# Patient Record
Sex: Female | Born: 1945 | Race: Black or African American | Hispanic: No | Marital: Married | Smoking: Never smoker
Health system: Southern US, Community
[De-identification: ages and names within clinical notes are randomized; demographics above are authoritative.]

## PROBLEM LIST (undated history)

## (undated) DIAGNOSIS — I10 Essential (primary) hypertension: Secondary | ICD-10-CM

## (undated) DIAGNOSIS — K219 Gastro-esophageal reflux disease without esophagitis: Secondary | ICD-10-CM

## (undated) DIAGNOSIS — Z87410 Personal history of cervical dysplasia: Secondary | ICD-10-CM

## (undated) DIAGNOSIS — M1990 Unspecified osteoarthritis, unspecified site: Secondary | ICD-10-CM

## (undated) DIAGNOSIS — H269 Unspecified cataract: Secondary | ICD-10-CM

## (undated) DIAGNOSIS — S83209A Unspecified tear of unspecified meniscus, current injury, unspecified knee, initial encounter: Secondary | ICD-10-CM

## (undated) DIAGNOSIS — E039 Hypothyroidism, unspecified: Secondary | ICD-10-CM

## (undated) HISTORY — PX: EYE SURGERY: SHX253

## (undated) HISTORY — DX: Essential (primary) hypertension: I10

## (undated) HISTORY — DX: Unspecified cataract: H26.9

## (undated) HISTORY — PX: CHOLECYSTECTOMY: SHX55

## (undated) HISTORY — PX: WISDOM TOOTH EXTRACTION: SHX21

---

## 1978-08-10 HISTORY — PX: TUBAL LIGATION: SHX77

## 1986-08-10 HISTORY — PX: GYNECOLOGIC CRYOSURGERY: SHX857

## 1998-02-06 ENCOUNTER — Other Ambulatory Visit: Admission: RE | Admit: 1998-02-06 | Discharge: 1998-02-06 | Payer: Self-pay | Admitting: Obstetrics and Gynecology

## 1999-03-12 ENCOUNTER — Other Ambulatory Visit: Admission: RE | Admit: 1999-03-12 | Discharge: 1999-03-12 | Payer: Self-pay | Admitting: Obstetrics and Gynecology

## 2000-02-23 ENCOUNTER — Other Ambulatory Visit: Admission: RE | Admit: 2000-02-23 | Discharge: 2000-02-23 | Payer: Self-pay | Admitting: Obstetrics and Gynecology

## 2001-02-23 ENCOUNTER — Other Ambulatory Visit: Admission: RE | Admit: 2001-02-23 | Discharge: 2001-02-23 | Payer: Self-pay | Admitting: Obstetrics and Gynecology

## 2002-03-31 ENCOUNTER — Other Ambulatory Visit: Admission: RE | Admit: 2002-03-31 | Discharge: 2002-03-31 | Payer: Self-pay | Admitting: Obstetrics and Gynecology

## 2003-04-02 ENCOUNTER — Other Ambulatory Visit: Admission: RE | Admit: 2003-04-02 | Discharge: 2003-04-02 | Payer: Self-pay | Admitting: Obstetrics and Gynecology

## 2004-04-02 ENCOUNTER — Other Ambulatory Visit: Admission: RE | Admit: 2004-04-02 | Discharge: 2004-04-02 | Payer: Self-pay | Admitting: Obstetrics and Gynecology

## 2004-10-30 ENCOUNTER — Ambulatory Visit (HOSPITAL_COMMUNITY): Admission: RE | Admit: 2004-10-30 | Discharge: 2004-10-30 | Payer: Self-pay | Admitting: Internal Medicine

## 2005-04-03 ENCOUNTER — Other Ambulatory Visit: Admission: RE | Admit: 2005-04-03 | Discharge: 2005-04-03 | Payer: Self-pay | Admitting: Obstetrics and Gynecology

## 2006-04-07 ENCOUNTER — Other Ambulatory Visit: Admission: RE | Admit: 2006-04-07 | Discharge: 2006-04-07 | Payer: Self-pay | Admitting: Obstetrics and Gynecology

## 2006-04-30 ENCOUNTER — Ambulatory Visit: Payer: Self-pay | Admitting: Gastroenterology

## 2006-05-13 ENCOUNTER — Ambulatory Visit: Payer: Self-pay | Admitting: Gastroenterology

## 2006-05-13 ENCOUNTER — Encounter (INDEPENDENT_AMBULATORY_CARE_PROVIDER_SITE_OTHER): Payer: Self-pay | Admitting: Specialist

## 2007-04-12 ENCOUNTER — Other Ambulatory Visit: Admission: RE | Admit: 2007-04-12 | Discharge: 2007-04-12 | Payer: Self-pay | Admitting: Obstetrics and Gynecology

## 2008-04-18 ENCOUNTER — Ambulatory Visit: Payer: Self-pay | Admitting: Obstetrics and Gynecology

## 2008-04-18 ENCOUNTER — Encounter: Payer: Self-pay | Admitting: Obstetrics and Gynecology

## 2008-04-18 ENCOUNTER — Other Ambulatory Visit: Admission: RE | Admit: 2008-04-18 | Discharge: 2008-04-18 | Payer: Self-pay | Admitting: Obstetrics and Gynecology

## 2009-04-19 ENCOUNTER — Encounter: Payer: Self-pay | Admitting: Obstetrics and Gynecology

## 2009-04-19 ENCOUNTER — Other Ambulatory Visit: Admission: RE | Admit: 2009-04-19 | Discharge: 2009-04-19 | Payer: Self-pay | Admitting: Obstetrics and Gynecology

## 2009-04-19 ENCOUNTER — Ambulatory Visit: Payer: Self-pay | Admitting: Obstetrics and Gynecology

## 2009-08-16 ENCOUNTER — Ambulatory Visit: Payer: Self-pay | Admitting: Obstetrics and Gynecology

## 2009-09-16 DIAGNOSIS — E785 Hyperlipidemia, unspecified: Secondary | ICD-10-CM

## 2009-09-19 ENCOUNTER — Ambulatory Visit: Payer: Self-pay | Admitting: Cardiology

## 2010-04-25 ENCOUNTER — Other Ambulatory Visit: Admission: RE | Admit: 2010-04-25 | Discharge: 2010-04-25 | Payer: Self-pay | Admitting: Obstetrics and Gynecology

## 2010-04-25 ENCOUNTER — Ambulatory Visit: Payer: Self-pay | Admitting: Obstetrics and Gynecology

## 2010-09-11 NOTE — Assessment & Plan Note (Signed)
Summary: np6/ lipid management/ pt ahs state bcbs/ gd   Visit Type:  new pt visit Referring Provider:  Edyth Gunnels Primary Provider:  Edyth Gunnels  CC:  pt her for hyperlipidemia.Marland Kitchenotherwise no other complaints today.  History of Present Illness: Cynthia Bowers is referred today by Dr.Gottsegen for evaluation of her hyperlipidemia.  She is a delightful 65 year old [place] American female who has had a history of mixed hyperlipidemia. Her total cholesterol average around 200 up until recently when it was 253. Her HDL is usually below 35. Her last was 38. Triglycerides were normal the past but now are starting to creep up to 153. Her LDL use runs around 140-150 the last check was 185. Her total cholesterol to HDL ratio was 6.7.  She has gained weight and is fairly liberal with her diet.  She does not smoke, does not have a history of hypertension, and is not diabetic. There is a history of hypertension and diabetes in her family that no premature coronary disease. Her age is now risk factor.  She denies any symptoms of angina or ischemia. She denies any dyspnea on exertion, orthopnea, PND. She's had no peripheral edema.  Current Medications (verified): 1)  Levothyroxine Sodium 50 Mcg Tabs (Levothyroxine Sodium) .Marland Kitchen.. 1 Tab Once Daily 2)  Calcium Carbonate-Vitamin D 600-400 Mg-Unit Tabs (Calcium Carbonate-Vitamin D) .Marland Kitchen.. 1 Tab Once Daily 3)  Afrin Nasal Spray 0.05 % Soln (Oxymetazoline Hcl) .... As Needed 4)  Diphenhydramine Hcl 25 Mg Caps (Diphenhydramine Hcl) .... As Needed  Allergies: 1)  ! Pcn  Past History:  Past Medical History: Last updated: 09/16/2009 HYPERLIPIDEMIA (ICD-272.4)    Past Surgical History: Last updated: 09/16/2009 Tubaligation...1980 Colonoscopy  Family History: Last updated: 09/16/2009 Family History of Diabetes: Sons and daughter Family History of Hypertension: Mother Family History of Cancer: Colon cancer..sister  Social History: Last updated:  09/16/2009 Tobacco Use - No.  Alcohol Use - no Regular Exercise - no Drug Use - no  Risk Factors: Exercise: no (09/16/2009)  Risk Factors: Smoking Status: never (09/16/2009)  Review of Systems       negative other than history of present illness  Vital Signs:  Patient profile:   65 year old female Height:      65 inches Weight:      172 pounds BMI:     28.73 Pulse rate:   67 / minute Pulse rhythm:   irregular BP sitting:   134 / 80  (left arm) Cuff size:   large  Vitals Entered By: Danielle Rankin, CMA (September 19, 2009 11:32 AM)  Physical Exam  General:  obese.   Head:  normocephalic and atraumatic Eyes:  PERRLA/EOM intact; conjunctiva and lids normal. Neck:  Neck supple, no JVD. No masses, thyromegaly or abnormal cervical nodes. Chest Riddick Nuon:  no deformities or breast masses noted Lungs:  Clear bilaterally to auscultation and percussion. Heart:  Non-displaced PMI, chest non-tender; regular rate and rhythm, S1, S2 without murmurs, rubs or gallops. Carotid upstroke normal, no bruit. Normal abdominal aortic size, no bruits. Femorals normal pulses, no bruits. Pedals normal pulses. No edema, no varicosities. Abdomen:  Bowel sounds positive; abdomen soft and non-tender without masses, organomegaly, or hernias noted. No hepatosplenomegaly. Msk:  Back normal, normal gait. Muscle strength and tone normal. Pulses:  pulses normal in all 4 extremities Extremities:  No clubbing or cyanosis. Neurologic:  Alert and oriented x 3. Skin:  Intact without lesions or rashes. Psych:  Normal affect.   EKG  Procedure date:  09/19/2009  Findings:      normal sinus rhythm, left H1 enlargement, poor R. progression, no acute changes.  Impression & Recommendations:  Problem # 1:  HYPERLIPIDEMIA (ICD-272.4) Assessment Deteriorated I have had a long talk with his Coumadin today. Unfortunately, because of dietary issues, aching, and lack of exercise her total cholesterol is starting to  increase substantially. With this as her LDL as well increasing. 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 Because of her weight, her triglycerides are starting to elevate as well.  I spent 20 minutes talking about dietary measures to lose weight, decrease her triglycerides, and decrease her LDL. We have talked about limiting carbohydrates including complex ones. We have talked about the limiting saturated and trans fats.I have encouraged her to walk 3 hours a week.  One she's lost 10-12 pounds, and following this diet and exercise, to repeat these values. If her lipid levels returned to previous values, I would not recommend any pharmacological care at this point. Also losing weight we'll decrease her risk of developing diabetes and hypertension. Again this was reviewed with her extensively. Orders: EKG w/ Interpretation (93000)  Patient Instructions: 1)  Your physician recommends that you schedule a follow-up appointment in: AS NEEDED 2)  Your physician recommends that you continue on your current medications as directed. Please refer to the Current Medication list given to you today.

## 2010-12-09 ENCOUNTER — Other Ambulatory Visit: Payer: Self-pay | Admitting: Internal Medicine

## 2010-12-16 ENCOUNTER — Ambulatory Visit (HOSPITAL_COMMUNITY)
Admission: RE | Admit: 2010-12-16 | Discharge: 2010-12-16 | Disposition: A | Discharge status: Home / Self Care | Payer: BC Managed Care – PPO | Source: Ambulatory Visit | Attending: Internal Medicine | Admitting: Internal Medicine

## 2010-12-16 DIAGNOSIS — M47812 Spondylosis without myelopathy or radiculopathy, cervical region: Secondary | ICD-10-CM | POA: Insufficient documentation

## 2010-12-16 DIAGNOSIS — K219 Gastro-esophageal reflux disease without esophagitis: Secondary | ICD-10-CM | POA: Insufficient documentation

## 2010-12-16 DIAGNOSIS — K449 Diaphragmatic hernia without obstruction or gangrene: Secondary | ICD-10-CM | POA: Insufficient documentation

## 2010-12-16 DIAGNOSIS — R1310 Dysphagia, unspecified: Secondary | ICD-10-CM | POA: Insufficient documentation

## 2011-05-06 ENCOUNTER — Ambulatory Visit (INDEPENDENT_AMBULATORY_CARE_PROVIDER_SITE_OTHER): Payer: BC Managed Care – PPO | Admitting: Obstetrics and Gynecology

## 2011-05-06 ENCOUNTER — Other Ambulatory Visit (HOSPITAL_COMMUNITY)
Admission: RE | Admit: 2011-05-06 | Discharge: 2011-05-06 | Disposition: A | Discharge status: Home / Self Care | Payer: BC Managed Care – PPO | Source: Ambulatory Visit | Attending: Obstetrics and Gynecology | Admitting: Obstetrics and Gynecology

## 2011-05-06 ENCOUNTER — Encounter: Payer: Self-pay | Admitting: Obstetrics and Gynecology

## 2011-05-06 VITALS — BP 124/78 | Ht 65.0 in | Wt 173.0 lb

## 2011-05-06 DIAGNOSIS — Z01419 Encounter for gynecological examination (general) (routine) without abnormal findings: Secondary | ICD-10-CM

## 2011-05-06 DIAGNOSIS — R82998 Other abnormal findings in urine: Secondary | ICD-10-CM

## 2011-05-06 DIAGNOSIS — E079 Disorder of thyroid, unspecified: Secondary | ICD-10-CM | POA: Insufficient documentation

## 2011-05-06 NOTE — Progress Notes (Signed)
Addended byCammie Mcgee T on: 05/06/2011 03:00 PM   Modules accepted: Orders

## 2011-05-06 NOTE — Progress Notes (Signed)
Patient came to see me for her annual GYN exam. She is doing well. She stopped her HRT and is fine without it. She is up-to-date on mammograms and bone densities. She is having no vaginal bleeding and no pelvic pain. She does her lab work from her PCP. 5 years ago she had a colonoscopy with a benign polyp. She has a sister who died from colon cancer.  Physical examination: HEENT within normal limits. Neck: Thyroid not large. No masses. Supraclavicular nodes: not enlarged. Breasts: Examined in both sitting midline position. No skin changes and no masses. Abdomen: Soft no guarding rebound or masses or hernia. Pelvic: External: Within normal limits. BUS: Within normal limits. Vaginal:within normal limits. Good estrogen effect. No evidence of cystocele rectocele or enterocele. Cervix: clean. Uterus: Normal size and shape. Adnexa: No masses. Rectovaginal exam: Confirmatory and negative. Extremities: Within normal limits.  Assessment: Normal GYN exam  Plan: Continue yearly mammograms. Call GI M.D. To schedule followup colonoscopy.

## 2011-05-11 ENCOUNTER — Encounter: Payer: Self-pay | Admitting: Gastroenterology

## 2011-05-20 ENCOUNTER — Encounter: Payer: Self-pay | Admitting: Gastroenterology

## 2011-05-20 ENCOUNTER — Ambulatory Visit (AMBULATORY_SURGERY_CENTER): Payer: BC Managed Care – PPO | Admitting: *Deleted

## 2011-05-20 VITALS — Ht 65.0 in | Wt 173.8 lb

## 2011-05-20 DIAGNOSIS — Z1211 Encounter for screening for malignant neoplasm of colon: Secondary | ICD-10-CM

## 2011-05-20 MED ORDER — PEG-KCL-NACL-NASULF-NA ASC-C 100 G PO SOLR: ORAL | Status: DC

## 2011-06-03 ENCOUNTER — Ambulatory Visit (AMBULATORY_SURGERY_CENTER): Payer: BC Managed Care – PPO | Admitting: Gastroenterology

## 2011-06-03 ENCOUNTER — Encounter: Payer: Self-pay | Admitting: Gastroenterology

## 2011-06-03 DIAGNOSIS — Z1211 Encounter for screening for malignant neoplasm of colon: Secondary | ICD-10-CM

## 2011-06-03 DIAGNOSIS — Z800 Family history of malignant neoplasm of digestive organs: Secondary | ICD-10-CM

## 2011-06-03 DIAGNOSIS — D126 Benign neoplasm of colon, unspecified: Secondary | ICD-10-CM

## 2011-06-03 DIAGNOSIS — Z86010 Personal history of colonic polyps: Secondary | ICD-10-CM

## 2011-06-03 MED ORDER — SODIUM CHLORIDE 0.9 % IV SOLN: 500.0000 mL | INTRAVENOUS | Status: DC

## 2011-06-03 NOTE — Progress Notes (Signed)
Pt tolerated the colonoscopy very well. maw 

## 2011-06-03 NOTE — Patient Instructions (Signed)
FOLLOW THE POST PROCEDURE INSTRUCTIONS ON THE GREEN AND BLUE SHEETS.  RESUME MEDICATIONS WITH EXCEPTION OF ASPIRIN PRODUCTS AND ANTIINFLAMMATORIES(MOTRIN, IBUPROFEN, ALEVE ETC.)  AWAIT PATHOLOGY RESULTS.

## 2011-06-04 ENCOUNTER — Telehealth: Payer: Self-pay | Admitting: *Deleted

## 2011-06-04 NOTE — Telephone Encounter (Signed)

## 2011-06-08 ENCOUNTER — Encounter: Payer: Self-pay | Admitting: Gastroenterology

## 2011-06-10 ENCOUNTER — Encounter: Payer: Self-pay | Admitting: Obstetrics and Gynecology

## 2011-06-22 ENCOUNTER — Encounter: Payer: Self-pay | Admitting: Obstetrics and Gynecology

## 2011-06-24 ENCOUNTER — Encounter: Payer: Self-pay | Admitting: Obstetrics and Gynecology

## 2012-03-31 ENCOUNTER — Ambulatory Visit (INDEPENDENT_AMBULATORY_CARE_PROVIDER_SITE_OTHER): Payer: BC Managed Care – PPO | Admitting: Family Medicine

## 2012-03-31 VITALS — BP 140/82 | HR 81 | Temp 98.8°F | Resp 18 | Ht 65.0 in | Wt 178.0 lb

## 2012-03-31 DIAGNOSIS — R1084 Generalized abdominal pain: Secondary | ICD-10-CM

## 2012-03-31 DIAGNOSIS — IMO0001 Reserved for inherently not codable concepts without codable children: Secondary | ICD-10-CM

## 2012-03-31 DIAGNOSIS — R252 Cramp and spasm: Secondary | ICD-10-CM

## 2012-03-31 LAB — POCT CBC
Granulocyte percent: 63.5 %G (ref 37–80)
HCT, POC: 39.7 % (ref 37.7–47.9)
Hemoglobin: 12.2 g/dL (ref 12.2–16.2)
Lymph, poc: 1.9 (ref 0.6–3.4)
MCH, POC: 29 pg (ref 27–31.2)
MCHC: 30.7 g/dL — AB (ref 31.8–35.4)
MCV: 94.4 fL (ref 80–97)
MID (cbc): 0.7 (ref 0–0.9)
MPV: 9.1 fL (ref 0–99.8)
POC Granulocyte: 4.4 (ref 2–6.9)
POC LYMPH PERCENT: 26.9 %L (ref 10–50)
POC MID %: 9.6 %M (ref 0–12)
Platelet Count, POC: 308 10*3/uL (ref 142–424)
RBC: 4.21 M/uL (ref 4.04–5.48)
RDW, POC: 13.3 %
WBC: 6.9 10*3/uL (ref 4.6–10.2)

## 2012-03-31 MED ORDER — MELOXICAM 7.5 MG PO TABS: 7.5000 mg | ORAL_TABLET | Freq: Two times a day (BID) | ORAL | Status: DC

## 2012-03-31 NOTE — Progress Notes (Signed)
[facility]:  Office Visit  Chief Complaint:  Chief Complaint  Patient presents with  . myalgias    soreness all over body  . Headache    HPI: Malyn Aytes is a 66 y.o. female who complains of  diffuse myalgia x 2 weeks.  1. Her neck has been sore since she started sleeping on a new pillow 2. Also she has had leg cramps and knee pain prior,  Myalgia and knee pain  improved with walking.   Denies fevers, HAs, chills, N/V, abd pain, rash, insect bites, tick bites, STDs + h/o arthritis but not rheumatoid  Past Medical History  Diagnosis Date  . Thyroid disease     hypothyroid   Past Surgical History  Procedure Date  . Tubal ligation    History   Social History  . Marital Status: Married    Spouse Name: N/A    Number of Children: N/A  . Years of Education: N/A   Social History Main Topics  . Smoking status: Never Smoker   . Smokeless tobacco: None  . Alcohol Use: No  . Drug Use: No  . Sexually Active: Yes    Birth Control/ Protection: Post-menopausal, Surgical   Other Topics Concern  . None   Social History Narrative  . None   Family History  Problem Relation Age of Onset  . Hypertension Mother   . Cancer Sister     COLON  . Colon cancer Sister 69  . Diabetes Brother   . Diabetes Daughter   . Diabetes Son   . Rectal cancer Neg Hx   . Stomach cancer Neg Hx    Allergies  Allergen Reactions  . Penicillins Rash   Prior to Admission medications   Medication Sig Start Date End Date Taking? Authorizing Provider  Calcium Carbonate-Vitamin D (CALCIUM + D PO) Take by mouth.      Historical Provider, MD  levothyroxine (SYNTHROID, LEVOTHROID) 75 MCG tablet Take 75 mcg by mouth daily.      Historical Provider, MD     ROS: The patient denies fevers, chills, night sweats, unintentional weight loss, chest pain, palpitations, wheezing, dyspnea on exertion, nausea, vomiting, abdominal pain, dysuria, hematuria, melena, numbness, weakness, or  tingling.  All other systems have been reviewed and were otherwise negative with the exception of those mentioned in the HPI and as above.    PHYSICAL EXAM: Filed Vitals:   03/31/12 1707  BP: 140/82  Pulse: 81  Temp: 98.8 F (37.1 C)  Resp: 18   Filed Vitals:   03/31/12 1707  Height: 5\' 5"  (1.651 m)  Weight: 178 lb (80.74 kg)   Body mass index is 29.62 kg/(m^2).  General: Alert, no acute distress HEENT:  Normocephalic, atraumatic, oropharynx patent. EOMI, PERRLA Cardiovascular:  Regular rate and rhythm, no rubs murmurs or gallops.  No Carotid bruits, radial pulse intact. No pedal edema.  Respiratory: Clear to auscultation bilaterally.  No wheezes, rales, or rhonchi.  No cyanosis, no use of accessory musculature GI: No organomegaly, abdomen is soft and non-tender, positive bowel sounds.  No masses. Skin: No rashes. Neurologic: Facial musculature symmetric. No meningeal signs Psychiatric: Patient is appropriate throughout our interaction. Lymphatic: No cervical lymphadenopathy Musculoskeletal: Gait intact. 5/5 strangth   LABS: Results for orders placed in visit on 03/31/12  POCT CBC      Component Value Range   WBC 6.9  4.6 - 10.2 K/uL   Lymph, poc 1.9  0.6 - 3.4   POC LYMPH  PERCENT 26.9  10 - 50 %L   MID (cbc) 0.7  0 - 0.9   POC MID % 9.6  0 - 12 %M   POC Granulocyte 4.4  2 - 6.9   Granulocyte percent 63.5  37 - 80 %G   RBC 4.21  4.04 - 5.48 M/uL   Hemoglobin 12.2  12.2 - 16.2 g/dL   HCT, POC 29.5  62.1 - 47.9 %   MCV 94.4  80 - 97 fL   MCH, POC 29.0  27 - 31.2 pg   MCHC 30.7 (*) 31.8 - 35.4 g/dL   RDW, POC 30.8     Platelet Count, POC 308  142 - 424 K/uL   MPV 9.1  0 - 99.8 fL     EKG/XRAY:   Primary read interpreted by Dr. Conley Rolls at [facility].   ASSESSMENT/PLAN: Encounter Diagnosis  Name Primary?  . Cramps, muscle, general Yes   Recent onset x 1-2 weeks, Will continue to monitor.  Patient has had new pillow to sleep on which maybe contrinuting to her neck  cramps. She does not have meningeal s/sxs. Her myalgia and knee pain are chronic, NKI, improves with walking so this may be consistent with OA.  Rx Mobic Awaiting labs    Aalyah Mansouri PHUONG, DO 04/01/2012 1:19 PM

## 2012-04-01 LAB — COMPREHENSIVE METABOLIC PANEL WITH GFR
ALT: 13 U/L (ref 0–35)
Alkaline Phosphatase: 65 U/L (ref 39–117)
BUN: 13 mg/dL (ref 6–23)
Calcium: 10 mg/dL (ref 8.4–10.5)
Chloride: 98 meq/L (ref 96–112)
Total Protein: 7.5 g/dL (ref 6.0–8.3)

## 2012-04-01 LAB — COMPREHENSIVE METABOLIC PANEL
AST: 21 U/L (ref 0–37)
Albumin: 4.7 g/dL (ref 3.5–5.2)
CO2: 28 mEq/L (ref 19–32)
Creat: 1.06 mg/dL (ref 0.50–1.10)
Glucose, Bld: 93 mg/dL (ref 70–99)
Potassium: 5 mEq/L (ref 3.5–5.3)
Sodium: 134 mEq/L — ABNORMAL LOW (ref 135–145)
Total Bilirubin: 0.5 mg/dL (ref 0.3–1.2)

## 2012-04-01 LAB — MAGNESIUM: Magnesium: 2.2 mg/dL (ref 1.5–2.5)

## 2012-04-12 ENCOUNTER — Encounter: Payer: Self-pay | Admitting: Family Medicine

## 2012-04-14 ENCOUNTER — Ambulatory Visit (INDEPENDENT_AMBULATORY_CARE_PROVIDER_SITE_OTHER): Payer: BC Managed Care – PPO | Admitting: Family Medicine

## 2012-04-14 VITALS — BP 137/75 | HR 75 | Temp 98.2°F | Resp 16 | Ht 64.6 in | Wt 172.0 lb

## 2012-04-14 DIAGNOSIS — R42 Dizziness and giddiness: Secondary | ICD-10-CM

## 2012-04-14 DIAGNOSIS — Z13220 Encounter for screening for lipoid disorders: Secondary | ICD-10-CM

## 2012-04-14 LAB — GLUCOSE, POCT (MANUAL RESULT ENTRY): POC Glucose: 101 mg/dL — AB (ref 70–99)

## 2012-04-14 NOTE — Progress Notes (Signed)
 [facility]:  Office Visit  Chief Complaint:  Chief Complaint  Patient presents with  . Dizziness    2 days    HPI: Cynthia Bowers is a 66 y.o. female who complains of : 2 day h/o dizziness, feels head is spinning. Yesterday occurred after walking outside and going inside from outside. Today she has had multiple episodes  Lasting a few seconds , Going  From sitting to standing position. She is also on narcotics for dental work.  Past Medical History  Diagnosis Date  . Thyroid disease     hypothyroid   Past Surgical History  Procedure Date  . Tubal ligation    History   Social History  . Marital Status: Married    Spouse Name: N/A    Number of Children: N/A  . Years of Education: N/A   Social History Main Topics  . Smoking status: Never Smoker   . Smokeless tobacco: None  . Alcohol Use: No  . Drug Use: No  . Sexually Active: Yes    Birth Control/ Protection: Post-menopausal, Surgical   Other Topics Concern  . None   Social History Narrative  . None   Family History  Problem Relation Age of Onset  . Hypertension Mother   . Cancer Sister     COLON  . Colon cancer Sister 12  . Diabetes Brother   . Diabetes Daughter   . Diabetes Son   . Rectal cancer Neg Hx   . Stomach cancer Neg Hx    Allergies  Allergen Reactions  . Penicillins Rash   Prior to Admission medications   Medication Sig Start Date End Date Taking? Authorizing Provider  Calcium Carbonate-Vitamin D (CALCIUM + D PO) Take by mouth.     Yes Historical Provider, MD  levothyroxine (SYNTHROID, LEVOTHROID) 75 MCG tablet Take 75 mcg by mouth daily.     Yes Historical Provider, MD  meloxicam (MOBIC) 7.5 MG tablet Take 1 tablet (7.5 mg total) by mouth 2 (two) times daily. 03/31/12 03/31/13 Yes  P , DO     ROS: The patient denies fevers, chills, night sweats, unintentional weight loss, chest pain, palpitations, wheezing, dyspnea on exertion, nausea, vomiting, abdominal pain,  dysuria, hematuria, melena, numbness, weakness, or tingling.  All other systems have been reviewed and were otherwise negative with the exception of those mentioned in the HPI and as above.    PHYSICAL EXAM: Filed Vitals:   04/14/12 1547  BP: 137/75  Pulse: 75  Temp:   Resp:    Filed Vitals:   04/14/12 1520  Height: 5' 4.6" (1.641 m)  Weight: 172 lb (78.019 kg)   Body mass index is 28.98 kg/(m^2).  General: Alert, no acute distress HEENT:  Normocephalic, atraumatic, oropharynx patent.  Cardiovascular:  Regular rate and rhythm, no rubs murmurs or gallops.  No Carotid bruits, radial pulse intact. No pedal edema.  Respiratory: Clear to auscultation bilaterally.  No wheezes, rales, or rhonchi.  No cyanosis, no use of accessory musculature GI: No organomegaly, abdomen is soft and non-tender, positive bowel sounds.  No masses. Skin: No rashes. Neurologic: Facial musculature symmetric. Psychiatric: Patient is appropriate throughout our interaction. Lymphatic: No cervical lymphadenopathy Musculoskeletal: Gait intact.   LABS: Results for orders placed in visit on 04/14/12  GLUCOSE, POCT (MANUAL RESULT ENTRY)      Component Value Range   POC Glucose 101 (*) 70 - 99 mg/dl     EKG/XRAY:   Primary read interpreted by Dr. Conley Rolls at  [facility].   ASSESSMENT/PLAN: Encounter Diagnosis  Name Primary?  . Dizziness Yes   orthostaitc nl Glucose nl Most likely related to narcotic use from dental work or  positional even though orthostatic nl. No other sxs.  Advise to monitor for worsening sxs.    Hamilton Capri PHUONG, DO 04/14/2012 3:56 PM

## 2012-04-15 ENCOUNTER — Ambulatory Visit (INDEPENDENT_AMBULATORY_CARE_PROVIDER_SITE_OTHER): Payer: BC Managed Care – PPO | Admitting: Family Medicine

## 2012-04-15 VITALS — BP 112/80 | HR 68 | Temp 98.3°F | Resp 16

## 2012-04-15 DIAGNOSIS — Z13220 Encounter for screening for lipoid disorders: Secondary | ICD-10-CM

## 2012-04-15 LAB — LIPID PANEL
Cholesterol: 200 mg/dL (ref 0–200)
HDL: 38 mg/dL — ABNORMAL LOW (ref >39)
LDL Cholesterol: 146 mg/dL — ABNORMAL HIGH (ref 0–99)
Total CHOL/HDL Ratio: 5.3 Ratio
Triglycerides: 81 mg/dL (ref <150)
VLDL: 16 mg/dL (ref 0–40)

## 2012-04-15 LAB — BASIC METABOLIC PANEL
BUN: 19 mg/dL (ref 6–23)
Calcium: 9.9 mg/dL (ref 8.4–10.5)
Creat: 1.04 mg/dL (ref 0.50–1.10)
Glucose, Bld: 92 mg/dL (ref 70–99)

## 2012-04-15 LAB — BASIC METABOLIC PANEL WITH GFR
CO2: 28 meq/L (ref 19–32)
Chloride: 104 meq/L (ref 96–112)
Potassium: 4.6 meq/L (ref 3.5–5.3)
Sodium: 139 meq/L (ref 135–145)

## 2012-04-19 NOTE — Progress Notes (Signed)
Labs only

## 2012-04-20 ENCOUNTER — Encounter: Payer: Self-pay | Admitting: Family Medicine

## 2012-04-26 ENCOUNTER — Ambulatory Visit (INDEPENDENT_AMBULATORY_CARE_PROVIDER_SITE_OTHER): Payer: BC Managed Care – PPO | Admitting: Family Medicine

## 2012-04-26 DIAGNOSIS — Z23 Encounter for immunization: Secondary | ICD-10-CM

## 2012-05-09 ENCOUNTER — Encounter: Payer: BC Managed Care – PPO | Admitting: Obstetrics and Gynecology

## 2012-05-10 ENCOUNTER — Ambulatory Visit (INDEPENDENT_AMBULATORY_CARE_PROVIDER_SITE_OTHER): Payer: BC Managed Care – PPO | Admitting: Obstetrics and Gynecology

## 2012-05-10 ENCOUNTER — Other Ambulatory Visit (HOSPITAL_COMMUNITY)
Admission: RE | Admit: 2012-05-10 | Discharge: 2012-05-10 | Disposition: A | Discharge status: Home / Self Care | Payer: BC Managed Care – PPO | Source: Ambulatory Visit | Attending: Obstetrics and Gynecology | Admitting: Obstetrics and Gynecology

## 2012-05-10 ENCOUNTER — Encounter: Payer: Self-pay | Admitting: Obstetrics and Gynecology

## 2012-05-10 VITALS — BP 130/80 | Ht 64.0 in | Wt 170.0 lb

## 2012-05-10 DIAGNOSIS — N390 Urinary tract infection, site not specified: Secondary | ICD-10-CM

## 2012-05-10 DIAGNOSIS — Z01419 Encounter for gynecological examination (general) (routine) without abnormal findings: Secondary | ICD-10-CM | POA: Insufficient documentation

## 2012-05-10 DIAGNOSIS — N879 Dysplasia of cervix uteri, unspecified: Secondary | ICD-10-CM | POA: Insufficient documentation

## 2012-05-10 NOTE — Patient Instructions (Signed)
Mammogram next month

## 2012-05-10 NOTE — Progress Notes (Addendum)
Patient came to see me today for her annual GYN exam. She is doing well menopausally without HRT. She is having no vaginal bleeding. She is having no pelvic pain. She does her lab through Dr. Ophelia Charter office. She has had 2 normal bone densities. She is due for her next mammogram in November. 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 She thinks it was about 20 years ago. We need to get her old chart for the exact date. She has had normal Pap smears since then. Her last Pap smear was 2012.  Physical examination:Cynthia Bowers present. HEENT within normal limits. Neck: Thyroid not large. No masses. Supraclavicular nodes: not enlarged. Breasts: Examined in both sitting and lying  position. No skin changes and no masses. Abdomen: Soft no guarding rebound or masses or hernia. Pelvic: External: Within normal limits. BUS: Within normal limits. Vaginal:within normal limits. Good estrogen effect. No evidence of cystocele rectocele or enterocele. Cervix: clean. Uterus: Normal size and shape. Adnexa: No masses. Rectovaginal exam: Confirmatory and negative. Extremities: Within normal limits.  Assessment: Cervical dysplasia  Plan: Mammogram in November. We will get her old chart for exact dates of cryosurgery. Pap done.The new Pap smear guidelines were discussed with the patient.   Addendum: Patient was treated for cervical dysplasia with cryosurgery in 1988.

## 2012-05-11 LAB — URINALYSIS W MICROSCOPIC + REFLEX CULTURE
Bilirubin Urine: NEGATIVE
Casts: NONE SEEN
Glucose, UA: NEGATIVE mg/dL
Hgb urine dipstick: NEGATIVE
Leukocytes, UA: NEGATIVE
Specific Gravity, Urine: 1.011 (ref 1.005–1.030)

## 2012-05-12 LAB — URINE CULTURE: Colony Count: >=100000

## 2012-06-27 ENCOUNTER — Encounter: Payer: Self-pay | Admitting: Obstetrics and Gynecology

## 2012-11-15 ENCOUNTER — Encounter (HOSPITAL_COMMUNITY): Payer: Self-pay

## 2012-11-15 ENCOUNTER — Emergency Department (HOSPITAL_COMMUNITY)
Admission: EM | Admit: 2012-11-15 | Discharge: 2012-11-15 | Disposition: A | Discharge status: Home / Self Care | Payer: BC Managed Care – PPO | Attending: Emergency Medicine | Admitting: Emergency Medicine

## 2012-11-15 DIAGNOSIS — S46909A Unspecified injury of unspecified muscle, fascia and tendon at shoulder and upper arm level, unspecified arm, initial encounter: Secondary | ICD-10-CM | POA: Insufficient documentation

## 2012-11-15 DIAGNOSIS — S8002XA Contusion of left knee, initial encounter: Secondary | ICD-10-CM

## 2012-11-15 DIAGNOSIS — Y92410 Unspecified street and highway as the place of occurrence of the external cause: Secondary | ICD-10-CM | POA: Insufficient documentation

## 2012-11-15 DIAGNOSIS — S8000XA Contusion of unspecified knee, initial encounter: Secondary | ICD-10-CM | POA: Insufficient documentation

## 2012-11-15 DIAGNOSIS — E039 Hypothyroidism, unspecified: Secondary | ICD-10-CM | POA: Insufficient documentation

## 2012-11-15 DIAGNOSIS — Z79899 Other long term (current) drug therapy: Secondary | ICD-10-CM | POA: Insufficient documentation

## 2012-11-15 DIAGNOSIS — Y9389 Activity, other specified: Secondary | ICD-10-CM | POA: Insufficient documentation

## 2012-11-15 DIAGNOSIS — S4980XA Other specified injuries of shoulder and upper arm, unspecified arm, initial encounter: Secondary | ICD-10-CM | POA: Insufficient documentation

## 2012-11-15 DIAGNOSIS — S139XXA Sprain of joints and ligaments of unspecified parts of neck, initial encounter: Secondary | ICD-10-CM | POA: Insufficient documentation

## 2012-11-15 DIAGNOSIS — Z7982 Long term (current) use of aspirin: Secondary | ICD-10-CM | POA: Insufficient documentation

## 2012-11-15 DIAGNOSIS — Z8742 Personal history of other diseases of the female genital tract: Secondary | ICD-10-CM | POA: Insufficient documentation

## 2012-11-15 DIAGNOSIS — S161XXA Strain of muscle, fascia and tendon at neck level, initial encounter: Secondary | ICD-10-CM

## 2012-11-15 MED ORDER — HYDROCODONE-ACETAMINOPHEN 5-325 MG PO TABS: 1.0000 | ORAL_TABLET | Freq: Four times a day (QID) | ORAL | Status: DC | PRN

## 2012-11-15 MED ORDER — HYDROCODONE-ACETAMINOPHEN 5-325 MG PO TABS
1.0000 | ORAL_TABLET | Freq: Once | ORAL | Status: AC
  Administered 2012-11-15: 1 via ORAL
  Filled 2012-11-15: qty 1
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
## 2012-11-15 NOTE — ED Provider Notes (Signed)
Medical screening examination/treatment/procedure(s) were performed by non-physician practitioner and as supervising physician I was immediately available for consultation/collaboration.    Celene Kras, MD 11/15/12 2229
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
## 2012-12-21 ENCOUNTER — Other Ambulatory Visit: Payer: Self-pay | Admitting: Orthopedic Surgery

## 2012-12-23 ENCOUNTER — Encounter (HOSPITAL_BASED_OUTPATIENT_CLINIC_OR_DEPARTMENT_OTHER): Payer: Self-pay | Admitting: *Deleted

## 2012-12-26 ENCOUNTER — Encounter (HOSPITAL_BASED_OUTPATIENT_CLINIC_OR_DEPARTMENT_OTHER): Payer: Self-pay | Admitting: *Deleted
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
## 2012-12-28 ENCOUNTER — Ambulatory Visit (HOSPITAL_BASED_OUTPATIENT_CLINIC_OR_DEPARTMENT_OTHER): Payer: BC Managed Care – PPO | Admitting: Anesthesiology

## 2012-12-28 ENCOUNTER — Ambulatory Visit (HOSPITAL_BASED_OUTPATIENT_CLINIC_OR_DEPARTMENT_OTHER)
Admission: RE | Admit: 2012-12-28 | Discharge: 2012-12-28 | Disposition: A | Discharge status: Home / Self Care | Payer: BC Managed Care – PPO | Source: Ambulatory Visit | Attending: Orthopedic Surgery | Admitting: Orthopedic Surgery

## 2012-12-28 ENCOUNTER — Encounter (HOSPITAL_BASED_OUTPATIENT_CLINIC_OR_DEPARTMENT_OTHER)
Admission: RE | Disposition: A | Discharge status: Home / Self Care | Payer: Self-pay | Source: Ambulatory Visit | Attending: Orthopedic Surgery

## 2012-12-28 ENCOUNTER — Encounter (HOSPITAL_BASED_OUTPATIENT_CLINIC_OR_DEPARTMENT_OTHER): Payer: Self-pay

## 2012-12-28 ENCOUNTER — Encounter (HOSPITAL_BASED_OUTPATIENT_CLINIC_OR_DEPARTMENT_OTHER): Payer: Self-pay | Admitting: Anesthesiology

## 2012-12-28 DIAGNOSIS — Z9889 Other specified postprocedural states: Secondary | ICD-10-CM

## 2012-12-28 DIAGNOSIS — IMO0002 Reserved for concepts with insufficient information to code with codable children: Secondary | ICD-10-CM | POA: Insufficient documentation

## 2012-12-28 DIAGNOSIS — K219 Gastro-esophageal reflux disease without esophagitis: Secondary | ICD-10-CM | POA: Insufficient documentation

## 2012-12-28 DIAGNOSIS — X58XXXA Exposure to other specified factors, initial encounter: Secondary | ICD-10-CM | POA: Insufficient documentation

## 2012-12-28 DIAGNOSIS — S83289A Other tear of lateral meniscus, current injury, unspecified knee, initial encounter: Secondary | ICD-10-CM | POA: Insufficient documentation

## 2012-12-28 DIAGNOSIS — E039 Hypothyroidism, unspecified: Secondary | ICD-10-CM | POA: Insufficient documentation

## 2012-12-28 DIAGNOSIS — Z79899 Other long term (current) drug therapy: Secondary | ICD-10-CM | POA: Insufficient documentation

## 2012-12-28 HISTORY — DX: Unspecified osteoarthritis, unspecified site: M19.90

## 2012-12-28 HISTORY — DX: Hypothyroidism, unspecified: E03.9

## 2012-12-28 HISTORY — DX: Unspecified tear of unspecified meniscus, current injury, unspecified knee, initial encounter: S83.209A

## 2012-12-28 HISTORY — PX: CHONDROPLASTY: SHX5177

## 2012-12-28 HISTORY — DX: Personal history of cervical dysplasia: Z87.410

## 2012-12-28 HISTORY — PX: KNEE ARTHROSCOPY WITH LATERAL MENISECTOMY: SHX6193

## 2012-12-28 HISTORY — PX: KNEE ARTHROSCOPY WITH MEDIAL MENISECTOMY: SHX5651

## 2012-12-28 HISTORY — DX: Gastro-esophageal reflux disease without esophagitis: K21.9

## 2012-12-28 LAB — POCT HEMOGLOBIN-HEMACUE: Hemoglobin: 13 g/dL (ref 12.0–15.0)

## 2012-12-28 SURGERY — ARTHROSCOPY, KNEE, WITH MEDIAL MENISCECTOMY
Anesthesia: General | Site: Knee | Laterality: Right | Wound class: Clean

## 2012-12-28 MED ORDER — HYDROCODONE-ACETAMINOPHEN 7.5-325 MG PO TABS
1.0000 | ORAL_TABLET | Freq: Four times a day (QID) | ORAL | Status: DC | PRN
  Administered 2012-12-28: 1 via ORAL
  Filled 2012-12-28: qty 1

## 2012-12-28 MED ORDER — MORPHINE SULFATE 4 MG/ML IJ SOLN
INTRAMUSCULAR | Status: DC | PRN
  Administered 2012-12-28: 4 mg via INTRAVENOUS

## 2012-12-28 MED ORDER — MIDAZOLAM HCL 5 MG/5ML IJ SOLN
INTRAMUSCULAR | Status: DC | PRN
  Administered 2012-12-28: 1 mg via INTRAVENOUS

## 2012-12-28 MED ORDER — FENTANYL CITRATE 0.05 MG/ML IJ SOLN
INTRAMUSCULAR | Status: DC | PRN
  Administered 2012-12-28: 25 ug via INTRAVENOUS
  Administered 2012-12-28: 50 ug via INTRAVENOUS
  Administered 2012-12-28: 25 ug via INTRAVENOUS
  Administered 2012-12-28: 50 ug via INTRAVENOUS
  Administered 2012-12-28 (×2): 25 ug via INTRAVENOUS

## 2012-12-28 MED ORDER — PROPOFOL 10 MG/ML IV BOLUS
INTRAVENOUS | Status: DC | PRN
  Administered 2012-12-28: 180 mg via INTRAVENOUS

## 2012-12-28 MED ORDER — KETOROLAC TROMETHAMINE 30 MG/ML IJ SOLN
INTRAMUSCULAR | Status: DC | PRN
  Administered 2012-12-28: 30 mg via INTRAVENOUS

## 2012-12-28 MED ORDER — HYDROCODONE-ACETAMINOPHEN 7.5-325 MG PO TABS: 1.0000 | ORAL_TABLET | Freq: Four times a day (QID) | ORAL | Status: DC | PRN

## 2012-12-28 MED ORDER — DEXAMETHASONE SODIUM PHOSPHATE 4 MG/ML IJ SOLN
INTRAMUSCULAR | Status: DC | PRN
  Administered 2012-12-28: 10 mg via INTRAVENOUS

## 2012-12-28 MED ORDER — SODIUM CHLORIDE 0.9 % IR SOLN
Status: DC | PRN
  Administered 2012-12-28: 3000 mL

## 2012-12-28 MED ORDER — SODIUM CHLORIDE 0.9 % IR SOLN
Status: DC | PRN
  Administered 2012-12-28: 10:00:00

## 2012-12-28 MED ORDER — LACTATED RINGERS IV SOLN
INTRAVENOUS | Status: DC
  Administered 2012-12-28: 08:00:00 via INTRAVENOUS
  Filled 2012-12-28: qty 1000

## 2012-12-28 MED ORDER — ONDANSETRON HCL 4 MG/2ML IJ SOLN
INTRAMUSCULAR | Status: DC | PRN
  Administered 2012-12-28: 4 mg via INTRAVENOUS

## 2012-12-28 MED ORDER — FENTANYL CITRATE 0.05 MG/ML IJ SOLN
25.0000 ug | INTRAMUSCULAR | Status: DC | PRN
  Administered 2012-12-28: 25 ug via INTRAVENOUS
  Filled 2012-12-28: qty 1

## 2012-12-28 MED ORDER — BUPIVACAINE-EPINEPHRINE 0.5% -1:200000 IJ SOLN
INTRAMUSCULAR | Status: DC | PRN
  Administered 2012-12-28: 20 mL

## 2012-12-28 MED ORDER — LIDOCAINE HCL (CARDIAC) 20 MG/ML IV SOLN
INTRAVENOUS | Status: DC | PRN
  Administered 2012-12-28: 70 mg via INTRAVENOUS

## 2012-12-28 MED ORDER — POVIDONE-IODINE 7.5 % EX SOLN
Freq: Once | CUTANEOUS | Status: DC
  Filled 2012-12-28: qty 118

## 2012-12-28 MED ORDER — PROMETHAZINE HCL 25 MG/ML IJ SOLN
6.2500 mg | INTRAMUSCULAR | Status: DC | PRN
  Filled 2012-12-28: qty 1

## 2012-12-28 SURGICAL SUPPLY — 51 items
BANDAGE ELASTIC 6 VELCRO ST LF (GAUZE/BANDAGES/DRESSINGS) ×3 IMPLANT
BANDAGE ESMARK 6X9 LF (GAUZE/BANDAGES/DRESSINGS) ×1 IMPLANT
BANDAGE GAUZE ELAST BULKY 4 IN (GAUZE/BANDAGES/DRESSINGS) ×2 IMPLANT
BLADE 4.2CUDA (BLADE) IMPLANT
BLADE CUDA 5.5 (BLADE) IMPLANT
BLADE CUDA SHAVER 3.5 (BLADE) ×2 IMPLANT
BLADE CUTTER GATOR 3.5 (BLADE) IMPLANT
BLADE GREAT WHITE 4.2 (BLADE) IMPLANT
BNDG CMPR 9X6 STRL LF SNTH (GAUZE/BANDAGES/DRESSINGS) ×1
BNDG ESMARK 6X9 LF (GAUZE/BANDAGES/DRESSINGS) ×2
CANISTER SUCT LVC 12 LTR MEDI- (MISCELLANEOUS) ×3 IMPLANT
CANISTER SUCTION 1200CC (MISCELLANEOUS) ×1 IMPLANT
CLOTH BEACON ORANGE TIMEOUT ST (SAFETY) ×2 IMPLANT
DRAPE ARTHROSCOPY W/POUCH 114 (DRAPES) ×2 IMPLANT
DRAPE LG THREE QUARTER DISP (DRAPES) ×2 IMPLANT
DRSG EMULSION OIL 3X3 NADH (GAUZE/BANDAGES/DRESSINGS) ×2 IMPLANT
DURAPREP 26ML APPLICATOR (WOUND CARE) ×2 IMPLANT
ELECT MENISCUS 165MM 90D (ELECTRODE) IMPLANT
ELECT REM PT RETURN 9FT ADLT (ELECTROSURGICAL)
ELECTRODE REM PT RTRN 9FT ADLT (ELECTROSURGICAL) IMPLANT
GLOVE BIO SURGEON STRL SZ 6.5 (GLOVE) ×1 IMPLANT
GLOVE BIO SURGEON STRL SZ7.5 (GLOVE) ×1 IMPLANT
GLOVE BIOGEL PI IND STRL 8 (GLOVE) ×1 IMPLANT
GLOVE BIOGEL PI INDICATOR 8 (GLOVE) ×1
GLOVE ECLIPSE 8.0 STRL XLNG CF (GLOVE) ×4 IMPLANT
GLOVE INDICATOR 6.5 STRL GRN (GLOVE) ×1 IMPLANT
GLOVE INDICATOR 8.0 STRL GRN (GLOVE) ×4 IMPLANT
GOWN PREVENTION PLUS LG XLONG (DISPOSABLE) ×1 IMPLANT
GOWN STRL REIN XL XLG (GOWN DISPOSABLE) ×1 IMPLANT
IV NS IRRIG 3000ML ARTHROMATIC (IV SOLUTION) ×3 IMPLANT
KNEE WRAP E Z 3 GEL PACK (MISCELLANEOUS) ×2 IMPLANT
NDL HYPO 18GX1.5 BLUNT FILL (NEEDLE) ×1 IMPLANT
NDL SAFETY ECLIPSE 18X1.5 (NEEDLE) ×1 IMPLANT
NEEDLE HYPO 18GX1.5 BLUNT FILL (NEEDLE) ×2 IMPLANT
NEEDLE HYPO 18GX1.5 SHARP (NEEDLE) ×2
PACK ARTHROSCOPY DSU (CUSTOM PROCEDURE TRAY) ×2 IMPLANT
PACK BASIN DAY SURGERY FS (CUSTOM PROCEDURE TRAY) ×2 IMPLANT
PADDING CAST ABS 4INX4YD NS (CAST SUPPLIES) ×1
PADDING CAST ABS COTTON 4X4 ST (CAST SUPPLIES) ×1 IMPLANT
PENCIL BUTTON HOLSTER BLD 10FT (ELECTRODE) IMPLANT
SET ARTHROSCOPY TUBING (MISCELLANEOUS) ×2
SET ARTHROSCOPY TUBING LN (MISCELLANEOUS) ×1 IMPLANT
SPONGE GAUZE 4X4 12PLY (GAUZE/BANDAGES/DRESSINGS) ×2 IMPLANT
SUT ETHIBOND 2 OS 4 DA (SUTURE) IMPLANT
SUT ETHILON 4 0 PS 2 18 (SUTURE) ×2 IMPLANT
SUT VIC AB 0 CT1 36 (SUTURE) IMPLANT
SUT VIC AB 2-0 PS2 27 (SUTURE) IMPLANT
SYRINGE 10CC LL (SYRINGE) ×2 IMPLANT
TOWEL OR 17X24 6PK STRL BLUE (TOWEL DISPOSABLE) ×2 IMPLANT
WAND 90 DEG TURBOVAC W/CORD (SURGICAL WAND) IMPLANT
WATER STERILE IRR 500ML POUR (IV SOLUTION) ×2 IMPLANT

## 2012-12-28 NOTE — Anesthesia Preprocedure Evaluation (Signed)
Anesthesia Evaluation  Patient identified by MRN, date of birth, ID band Patient awake    Reviewed: Allergy & Precautions, H&P , NPO status , Patient's Chart, lab work & pertinent test results  Airway Mallampati: II TM Distance: >3 FB Neck ROM: Full    Dental no notable dental hx.    Pulmonary neg pulmonary ROS,  breath sounds clear to auscultation  Pulmonary exam normal       Cardiovascular Exercise Tolerance: Good negative cardio ROS  Rhythm:Regular Rate:Normal     Neuro/Psych negative neurological ROS  negative psych ROS   GI/Hepatic Neg liver ROS, GERD-  ,  Endo/Other  Hypothyroidism   Renal/GU negative Renal ROS  negative genitourinary   Musculoskeletal negative musculoskeletal ROS (+)   Abdominal   Peds negative pediatric ROS (+)  Hematology negative hematology ROS (+)   Anesthesia Other Findings   Reproductive/Obstetrics negative OB ROS                           Anesthesia Physical Anesthesia Plan  ASA: II  Anesthesia Plan: General   Post-op Pain Management:    Induction: Intravenous  Airway Management Planned: LMA  Additional Equipment:   Intra-op Plan:   Post-operative Plan: Extubation in OR  Informed Consent: I have reviewed the patients History and Physical, chart, labs and discussed the procedure including the risks, benefits and alternatives for the proposed anesthesia with the patient or authorized representative who has indicated his/her understanding and acceptance.   Dental advisory given  Plan Discussed with: CRNA  Anesthesia Plan Comments:         Anesthesia Quick Evaluation

## 2012-12-28 NOTE — Transfer of Care (Signed)
Immediate Anesthesia Transfer of Care Note  Patient: Cynthia Bowers  Procedure(s) Performed: Procedure(s) (LRB): RIGHT KNEE ARTHROSCOPY WITH MEDIAL  MENISECTOMY (Right)  SHAVING OF LATERAL AND MEDIAL  FEMORAL CHONDRAL (Right) KNEE ARTHROSCOPY WITH LATERAL MENISECTOMY (Right)  Patient Location: PACU  Anesthesia Type: General  Level of Consciousness: awake, alert  and oriented  Airway & Oxygen Therapy: Patient Spontanous Breathing and Patient connected to face mask oxygen  Post-op Assessment: Report given to PACU RN and Post -op Vital signs reviewed and stable  Post vital signs: Reviewed and stable  Complications: No apparent anesthesia complications

## 2012-12-28 NOTE — Anesthesia Postprocedure Evaluation (Signed)
  Anesthesia Post-op Note  Patient: Cynthia Bowers  Procedure(s) Performed: Procedure(s) (LRB): RIGHT KNEE ARTHROSCOPY WITH MEDIAL  MENISECTOMY (Right)  SHAVING OF LATERAL AND MEDIAL  FEMORAL CHONDRAL (Right) KNEE ARTHROSCOPY WITH LATERAL MENISECTOMY (Right)  Patient Location: PACU  Anesthesia Type: General  Level of Consciousness: awake and alert   Airway and Oxygen Therapy: Patient Spontanous Breathing  Post-op Pain: mild  Post-op Assessment: Post-op Vital signs reviewed, Patient's Cardiovascular Status Stable, Respiratory Function Stable, Patent Airway and No signs of Nausea or vomiting  Last Vitals:  Filed Vitals:   12/28/12 1145  BP: 116/83  Pulse: 64  Temp:   Resp: 12    Post-op Vital Signs: stable   Complications: No apparent anesthesia complications

## 2012-12-28 NOTE — Anesthesia Procedure Notes (Signed)
Procedure Name: LMA Insertion Date/Time: 12/28/2012 9:35 AM Performed by: Norva Pavlov Pre-anesthesia Checklist: Patient identified, Emergency Drugs available, Suction available and Patient being monitored Patient Re-evaluated:Patient Re-evaluated prior to inductionOxygen Delivery Method: Circle System Utilized Preoxygenation: Pre-oxygenation with 100% oxygen Intubation Type: IV induction Ventilation: Mask ventilation without difficulty LMA: LMA inserted LMA Size: 4.0 Number of attempts: 1 Airway Equipment and Method: bite block Placement Confirmation: positive ETCO2 Tube secured with: Tape Dental Injury: Teeth and Oropharynx as per pre-operative assessment

## 2012-12-28 NOTE — H&P (Signed)
Cynthia Bowers is an 67 y.o. female.   Chief Complaint:painful rt knee HPI: MRI demonstrates torn medial meniscus  Past Medical History  Diagnosis Date  . Hypothyroidism   . History of cervical dysplasia   . Acute meniscal tear of knee     right knee  . Mild acid reflux     watches diet  . Arthritis     knee    Past Surgical History  Procedure Laterality Date  . Colposcopy    . Gynecologic cryosurgery  1988    CERVICAL DYSPLASIA  . Tubal ligation  1980    Family History  Problem Relation Age of Onset  . Hypertension Mother   . Cancer Sister     COLON  . Colon cancer Sister 50  . Diabetes Brother   . Diabetes Daughter   . Diabetes Son   . Rectal cancer Neg Hx   . Stomach cancer Neg Hx    Social History:  reports that she has never smoked. She has never used smokeless tobacco. She reports that she does not drink alcohol or use illicit drugs.  Allergies:  Allergies  Allergen Reactions  . Penicillins Rash    Medications Prior to Admission  Medication Sig Dispense Refill  . ibuprofen (ADVIL,MOTRIN) 200 MG tablet Take 200 mg by mouth every 6 (six) hours as needed for pain.      Marland Kitchen levothyroxine (SYNTHROID, LEVOTHROID) 88 MCG tablet Take 88 mcg by mouth daily before breakfast.        No results found for this or any previous visit (from the past 48 hour(s)). No results found.  ROS  Blood pressure 138/93, pulse 82, temperature 97.6 F (36.4 C), temperature source Oral, resp. rate 18, height 5\' 4"  (1.626 m), weight 80.287 kg (177 lb), SpO2 97.00%. Physical Exam  Constitutional: She is oriented to person, place, and time. She appears well-developed and well-nourished.  HENT:  Head: Normocephalic and atraumatic.  Right Ear: External ear normal.  Left Ear: External ear normal.  Nose: Nose normal.  Mouth/Throat: Oropharynx is clear and moist.  Eyes: Conjunctivae and EOM are normal. Pupils are equal, round, and reactive to light.  Neck: Normal range of motion. Neck  supple.  Respiratory: Effort normal and breath sounds normal.  GI: Soft. Bowel sounds are normal.  Musculoskeletal: Normal range of motion. She exhibits tenderness.  Tender medial joint line rt knee  Neurological: She is alert and oriented to person, place, and time. She has normal reflexes.  Skin: Skin is warm and dry.  Psychiatric: She has a normal mood and affect. Her behavior is normal. Judgment and thought content normal.     Assessment/Plan Torn medial meniscus rt knee Rt knee arthroscopy with partial medial meniscectomy  Cesario Weidinger P 12/28/2012, 8:40 AM
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
## 2012-12-29 NOTE — Progress Notes (Signed)
Patient stated home waves of nausea but no vomiting.  It has improved but not completely gone away.  Instructed to eat light and if does not improve to call surgeon.

## 2013-01-03 ENCOUNTER — Encounter (HOSPITAL_BASED_OUTPATIENT_CLINIC_OR_DEPARTMENT_OTHER): Payer: Self-pay | Admitting: Orthopedic Surgery

## 2013-03-29 ENCOUNTER — Emergency Department (HOSPITAL_COMMUNITY)
Admission: EM | Admit: 2013-03-29 | Discharge: 2013-03-29 | Disposition: A | Discharge status: Home / Self Care | Payer: Worker's Compensation | Attending: Emergency Medicine | Admitting: Emergency Medicine

## 2013-03-29 ENCOUNTER — Encounter (HOSPITAL_COMMUNITY): Payer: Self-pay | Admitting: *Deleted

## 2013-03-29 DIAGNOSIS — Z8719 Personal history of other diseases of the digestive system: Secondary | ICD-10-CM | POA: Insufficient documentation

## 2013-03-29 DIAGNOSIS — Z880 Allergy status to penicillin: Secondary | ICD-10-CM | POA: Insufficient documentation

## 2013-03-29 DIAGNOSIS — M1710 Unilateral primary osteoarthritis, unspecified knee: Secondary | ICD-10-CM | POA: Insufficient documentation

## 2013-03-29 DIAGNOSIS — Z87410 Personal history of cervical dysplasia: Secondary | ICD-10-CM | POA: Insufficient documentation

## 2013-03-29 DIAGNOSIS — Z79899 Other long term (current) drug therapy: Secondary | ICD-10-CM | POA: Insufficient documentation

## 2013-03-29 DIAGNOSIS — Y929 Unspecified place or not applicable: Secondary | ICD-10-CM | POA: Insufficient documentation

## 2013-03-29 DIAGNOSIS — T6391XA Toxic effect of contact with unspecified venomous animal, accidental (unintentional), initial encounter: Secondary | ICD-10-CM | POA: Insufficient documentation

## 2013-03-29 DIAGNOSIS — Y9389 Activity, other specified: Secondary | ICD-10-CM | POA: Insufficient documentation

## 2013-03-29 DIAGNOSIS — T63461A Toxic effect of venom of wasps, accidental (unintentional), initial encounter: Secondary | ICD-10-CM | POA: Insufficient documentation

## 2013-03-29 DIAGNOSIS — E039 Hypothyroidism, unspecified: Secondary | ICD-10-CM | POA: Insufficient documentation

## 2013-03-29 MED ORDER — NAPROXEN SODIUM 220 MG PO TABS: 220.0000 mg | ORAL_TABLET | Freq: Two times a day (BID) | ORAL | Status: DC | PRN

## 2013-03-29 MED ORDER — DIPHENHYDRAMINE HCL 25 MG PO CAPS
25.0000 mg | ORAL_CAPSULE | Freq: Once | ORAL | Status: AC
  Administered 2013-03-29: 25 mg via ORAL
  Filled 2013-03-29: qty 1

## 2013-03-29 MED ORDER — IBUPROFEN 800 MG PO TABS
800.0000 mg | ORAL_TABLET | Freq: Once | ORAL | Status: AC
  Administered 2013-03-29: 800 mg via ORAL
  Filled 2013-03-29: qty 1

## 2013-03-29 MED ORDER — DIPHENHYDRAMINE HCL 25 MG PO TABS: 25.0000 mg | ORAL_TABLET | Freq: Four times a day (QID) | ORAL | Status: DC

## 2013-03-29 NOTE — ED Notes (Signed)
Bed: [facility] Expected date:  Expected time:  Means of arrival:  Comments: ems 

## 2013-03-29 NOTE — Progress Notes (Signed)
[facility] provided patient with a list of primary care resources in [place].  °

## 2013-03-29 NOTE — ED Notes (Signed)
Patient states she was stung by a hornet while at work.  Patient was stung in rt shoulder per EMS which was notified because patient states she had a severe reaction as a child when stung by a different type of bee she states she had hives and fainted as a child. Presents today with pain at the site and soreness in her neck and arms pain 6/10. No difficulty swallowing and VS stable.

## 2013-03-29 NOTE — ED Provider Notes (Signed)
CSN: [id number]     Arrival date & time 03/29/13  0940 History  First MD Initiated Contact with Patient 03/29/13 [phone]     Chief Complaint  Patient presents with  . Insect Bite    Stung by hornet at work    HPI Patient presents to the emergency room after getting stung by some insect while she was at work.   Patient thinks it might of been hornet. She's not really sure. Stung in her right shoulder. Since that time she's had persistent pain and soreness in that area. Patient came to the emergency room because she was concerned about the possibility of an allergic reaction. When she was a child she was stung by an insect developed hives and fainted.  She denies any difficulty with her breathing or swallowing. She has not developed any rash or itching. Past Medical History  Diagnosis Date  . Hypothyroidism   . History of cervical dysplasia   . Acute meniscal tear of knee     right knee  . Mild acid reflux     watches diet  . Arthritis     knee   Past Surgical History  Procedure Laterality Date  . Colposcopy    . Gynecologic cryosurgery  1988    CERVICAL DYSPLASIA  . Tubal ligation  1980  . Knee arthroscopy with medial menisectomy Right 12/28/2012    Procedure: RIGHT KNEE ARTHROSCOPY WITH MEDIAL  MENISECTOMY;  Surgeon: Drucilla Schmidt, MD;  Location: [facility];  Service: Orthopedics;  Laterality: Right;  . Chondroplasty Right 12/28/2012    Procedure:  SHAVING OF LATERAL AND MEDIAL  FEMORAL CHONDRAL;  Surgeon: Drucilla Schmidt, MD;  Location: [facility];  Service: Orthopedics;  Laterality: Right;  . Knee arthroscopy with lateral menisectomy Right 12/28/2012    Procedure: KNEE ARTHROSCOPY WITH LATERAL MENISECTOMY;  Surgeon: Drucilla Schmidt, MD;  Location: [facility];  Service: Orthopedics;  Laterality: Right;   Family History  Problem Relation Age of Onset  . Hypertension Mother   . Cancer Sister     COLON  . Colon cancer Sister 6   . Diabetes Brother   . Diabetes Daughter   . Diabetes Son   . Rectal cancer Neg Hx   . Stomach cancer Neg Hx    History  Substance Use Topics  . Smoking status: Never Smoker   . Smokeless tobacco: Never Used  . Alcohol Use: No   OB History   Grav Para Term Preterm Abortions TAB SAB Ect Mult Living   3 3 3       3      Review of Systems  All other systems reviewed and are negative.    Allergies  Penicillins  Home Medications   Current Outpatient Rx  Name  Route  Sig  Dispense  Refill  . calcium carbonate (OS-CAL) 600 MG TABS tablet   Oral   Take 600 mg by mouth daily with breakfast.         . glucosamine-chondroitin 500-400 MG tablet   Oral   Take 1 tablet by mouth 2 (two) times daily.         Marland Kitchen levothyroxine (SYNTHROID, LEVOTHROID) 88 MCG tablet   Oral   Take 88 mcg by mouth daily before breakfast.         . naproxen sodium (ANAPROX) 220 MG tablet   Oral   Take 220 mg by mouth 2 (two) times daily as needed (for pain).  BP 129/83  Pulse 77  Temp(Src) 98.9 F (37.2 C) (Oral)  Resp 20  Ht 5\' 5"  (1.651 m)  Wt 168 lb (76.204 kg)  BMI 27.96 kg/m2  SpO2 98% Physical Exam  Nursing note and vitals reviewed. Constitutional: She appears well-developed and well-nourished. No distress.  HENT:  Head: Normocephalic and atraumatic.  Right Ear: External ear normal.  Left Ear: External ear normal.  Eyes: Conjunctivae are normal. Right eye exhibits no discharge. Left eye exhibits no discharge. No scleral icterus.  Neck: Neck supple. No tracheal deviation present.  Cardiovascular: Normal rate, regular rhythm and intact distal pulses.   Pulmonary/Chest: Effort normal and breath sounds normal. No stridor. No respiratory distress. She has no wheezes. She has no rales.  Abdominal: Soft. Bowel sounds are normal. She exhibits no distension. There is no tenderness. There is no rebound and no guarding.  Musculoskeletal: She exhibits no edema and no tenderness.   Small area of tenderness in the right trapezius region no significant erythema, no urticaria  Neurological: She is alert. She has normal strength. No sensory deficit. Cranial nerve deficit:  no gross defecits noted. She exhibits normal muscle tone. She displays no seizure activity. Coordination normal.  Skin: Skin is warm and dry. No rash noted.  Psychiatric: She has a normal mood and affect.    ED Course   Procedures (including critical care time)  Labs Reviewed - No data to display No results found.  Diagnosis: Insect sting MDM  Patient doesn't appear to be having an allergic reaction. I will give her a dose of ibuprofen and Benadryl for symptomatic relief. I will watch her in the emergency department  to make sure she does not start developing any allergic type reactions.    Celene Kras, MD 03/29/13 1018

## 2013-05-23 ENCOUNTER — Ambulatory Visit (INDEPENDENT_AMBULATORY_CARE_PROVIDER_SITE_OTHER): Payer: BC Managed Care – PPO | Admitting: Gynecology

## 2013-05-23 ENCOUNTER — Encounter: Payer: Self-pay | Admitting: Gynecology

## 2013-05-23 VITALS — BP 120/74 | Ht 65.0 in | Wt 185.0 lb

## 2013-05-23 DIAGNOSIS — Z01419 Encounter for gynecological examination (general) (routine) without abnormal findings: Secondary | ICD-10-CM

## 2013-05-23 DIAGNOSIS — Z23 Encounter for immunization: Secondary | ICD-10-CM

## 2013-05-23 DIAGNOSIS — N952 Postmenopausal atrophic vaginitis: Secondary | ICD-10-CM

## 2013-05-23 NOTE — Addendum Note (Signed)
Addended by: Dayna Barker on: 05/23/2013 12:39 PM   Modules accepted: Orders

## 2013-05-23 NOTE — Patient Instructions (Signed)
Follow up in one year, sooner as needed. 

## 2013-05-23 NOTE — Progress Notes (Signed)
Cynthia Bowers [birth year] [id number]        67 y.o.  G3P3003 for annual exam.  Former patient of Dr. Eda Paschal. Doing well.  Past medical history,surgical history, medications, allergies, family history and social history were all reviewed and documented in the EPIC chart.  ROS:  Performed and pertinent positives and negatives are included in the history, assessment and plan .  Exam: Kim assistant Filed Vitals:   05/23/13 1152  BP: 120/74  Height: 5\' 5"  (1.651 m)  Weight: 185 lb (83.915 kg)   General appearance  Normal Skin grossly normal Head/Neck normal with no cervical or supraclavicular adenopathy thyroid normal Lungs  clear Cardiac RR, without RMG Abdominal  soft, nontender, without masses, organomegaly or hernia Breasts  examined lying and sitting without masses, retractions, discharge or axillary adenopathy. Pelvic  Ext/BUS/vagina  normal with atrophic changes  Cervix  normal with atrophic changes  Uterus  axial to anteverted, normal size, shape and contour, midline and mobile nontender   Adnexa  Without masses or tenderness    Anus and perineum  normal   Rectovaginal  normal sphincter tone without palpated masses or tenderness.    Assessment/Plan:  67 y.o. G86P3003 female for annual exam.   1. Postmenopausal. Overall doing well without significant hot flushes, night sweats, vaginal dryness or dyspareunia. No bleeding. Patient knows to report any bleeding. 2. Pap smear 2013. No Pap smear done today. History of cryosurgery 1988 for cervical dysplasia. Pap smears normal since then. Discussed current screening guidelines offshoots to stop screening altogether less frequent screening intervals reviewed. We'll readdress on annual basis. 3. Mammography coming due in November and I reminded her to schedule this. SBE monthly reviewed. 4. DEXA 2007 normal. Plan repeat in 2 years at age 75. Increase calcium and vitamin D reviewed. 5. Colonoscopy 2012. Repeat at their recommended  interval. 6. Health maintenance. No blood work done as this is all done through her primary physician's office. Followup one year, sooner as needed   Note: This document was prepared with digital dictation and possible smart phrase technology. Any transcriptional errors that result from this process are unintentional.   [name] MD, 12:22 PM 05/23/2013

## 2013-05-24 LAB — URINALYSIS W MICROSCOPIC + REFLEX CULTURE
Bilirubin Urine: NEGATIVE
Casts: NONE SEEN
Crystals: NONE SEEN
Glucose, UA: NEGATIVE mg/dL
Ketones, ur: NEGATIVE mg/dL
Specific Gravity, Urine: 1.009 (ref 1.005–1.030)
Urobilinogen, UA: 0.2 mg/dL (ref 0.0–1.0)

## 2013-05-26 ENCOUNTER — Other Ambulatory Visit: Payer: Self-pay | Admitting: Gynecology

## 2013-05-26 MED ORDER — SULFAMETHOXAZOLE-TMP DS 800-160 MG PO TABS: 1.0000 | ORAL_TABLET | Freq: Two times a day (BID) | ORAL | Status: DC

## 2013-05-27 LAB — URINE CULTURE

## 2013-06-28 ENCOUNTER — Encounter: Payer: Self-pay | Admitting: Gynecology

## 2013-08-10 HISTORY — PX: COLONOSCOPY: SHX174

## 2013-10-24 ENCOUNTER — Ambulatory Visit (INDEPENDENT_AMBULATORY_CARE_PROVIDER_SITE_OTHER): Payer: BC Managed Care – PPO | Admitting: Family Medicine

## 2013-10-24 VITALS — BP 152/76 | HR 76 | Temp 98.0°F | Resp 18 | Ht 65.5 in | Wt 183.0 lb

## 2013-10-24 DIAGNOSIS — M629 Disorder of muscle, unspecified: Secondary | ICD-10-CM

## 2013-10-24 DIAGNOSIS — M25559 Pain in unspecified hip: Secondary | ICD-10-CM

## 2013-10-24 DIAGNOSIS — M7630 Iliotibial band syndrome, unspecified leg: Secondary | ICD-10-CM

## 2013-10-24 DIAGNOSIS — M25552 Pain in left hip: Secondary | ICD-10-CM

## 2013-10-24 DIAGNOSIS — M2420 Disorder of ligament, unspecified site: Secondary | ICD-10-CM

## 2013-10-24 MED ORDER — PREDNISONE 20 MG PO TABS: ORAL_TABLET | ORAL | Status: DC

## 2013-10-24 NOTE — Patient Instructions (Signed)
Iliotibial Band Syndrome  Iliotibial band syndrome is pain in the outer, lower thigh. The pain is caused by an inflammation of the iliotibial band. This is a band of thick fibrous tissue that runs down the outside of the thigh. The iliotibial band begins at the hip. It extends to the outer side of the shin bone (tibia) just below the knee joint. The band works with the thigh muscles. Together they provide stability to the outside of the knee joint.  Iliotibial band syndrome occurs when there is inflammation to this band of tissue. This is typically due to over use and not due to an injury. The irritation usually occurs over the outside of the knee joint, at the the end of the thigh bone (femur). The iliotibial band crosses bone and muscle at this point. Between these structures is a cushioning sac (bursa). The bursa should make possible a smooth gliding motion. However, when inflamed, the iliotibial band does not glide easily. When inflamed, there is pain with motion of the knee. Usually the pain worsens with continued movement and the pain goes away with rest.  This problem usually arises when there is a sudden increase in sports activities involving your legs. Running, and playing soccer or basketball are examples of activities causing this. Others who are prone to iliotibial band syndrome include individuals with mechanical problems such as leg length differences, abnormality of walking, bowed legs etc.  HOME CARE INSTRUCTIONS   · Apply ice to the injured area:  · Put ice in a plastic bag.  · Place a towel between your skin and the bag.  · Leave the ice on for 20 minutes, 2 3 times a day.  · Limit excessive training or eliminate training until pain goes away.  · While pain is present, you may use gentle range of motion. Do not resume regular use until instructed by your health care provider. Begin use gradually. Do not increase activity to the point of pain. If pain does develop, decrease activity and continue  the above measures. Gradually increase activities that do not cause discomfort. Do this until you finally achieve normal use.  · Perform low-impact activities while pain is present. Wear proper footwear.  · Only take over-the-counter or prescription medicines for pain, discomfort, or fever as directed by your health care provider.  SEEK MEDICAL CARE IF:   · Your pain increases or pain is not controlled with medications.  · You develop new, unexplained symptoms, or an increase of the symptoms that brought you to your health care provider.  · Your pain and symptoms are not improving or are getting worse.  Document Released: 01/16/2002 Document Revised: 05/17/2013 Document Reviewed: 02/23/2013  Exit[employer]® Patient Information ©2014 [employer].

## 2013-10-24 NOTE — Progress Notes (Signed)
° °  Subjective:    Patient ID: Cynthia Bowers, female    DOB: 1945/12/10, 68 y.o.   MRN: [id number] This chart was scribed for Robyn Haber, MD by Anastasia Pall, ED Scribe. This patient was seen in room 09 and the patient's care was started at 4:41 PM.  Chief Complaint  Patient presents with   Hip Pain    L hip pain, radiates down outer part of left leg. Started Saturday, worse today   HPI Cynthia Bowers is a 68 y.o. female Pt presents with gradually worsening, intermittent, left hip pain, that radiates down her lateral left LE with movement, onset 4 days ago. She denies recent falls and injuries. She denies pain in her hip when not moving. She denies heavy lifting, but states she does a lot of walking at work. She works at a school. She denies any other symptoms, stating she is otherwise healthy. She takes medication for her h/o thyroid disease. She denies h/o osteoporosis, but takes preventative medication.   PCP - Foye Spurling, MD  Patient Active Problem List   Diagnosis Date Noted   Cervical dysplasia    Thyroid disease    HYPERLIPIDEMIA 09/16/2009   Prior to Admission medications   Medication Sig Start Date End Date Taking? Authorizing Provider  calcium carbonate (OS-CAL) 600 MG TABS tablet Take 600 mg by mouth daily with breakfast.   Yes Historical Provider, MD  levothyroxine (SYNTHROID, LEVOTHROID) 88 MCG tablet Take 88 mcg by mouth daily before breakfast.   Yes Historical Provider, MD  diphenhydrAMINE (BENADRYL) 25 MG tablet Take 1 tablet (25 mg total) by mouth every 6 (six) hours. 03/29/13   Kathalene Frames, MD  glucosamine-chondroitin 500-400 MG tablet Take 1 tablet by mouth 2 (two) times daily.    Historical Provider, MD  naproxen sodium (ANAPROX) 220 MG tablet Take 1 tablet (220 mg total) by mouth 2 (two) times daily as needed (for pain). 03/29/13   Kathalene Frames, MD  sulfamethoxazole-trimethoprim (BACTRIM DS) 800-160 MG per tablet Take 1 tablet by mouth 2 (two) times daily.  05/26/13   Anastasio Auerbach, MD   Review of Systems  Musculoskeletal: Positive for myalgias (left hip). Negative for joint swelling.  Skin: Negative for wound.   BP 152/76   Pulse 76   Temp(Src) 98 F (36.7 C) (Oral)   Resp 18   Ht 5' 5.5" (1.664 m)   Wt 183 lb (83.008 kg)   BMI 29.98 kg/m2   SpO2 96%     Objective:   Physical Exam Nursing note and vitals reviewed. Constitutional: Pt is oriented to person, place, and time. Pt appears well-developed and well-nourished. No distress.  HENT: Right and left external ear normal.  Head: Normocephalic and atraumatic.  Eyes: EOM are normal.  Neck: Neck supple. Cardiovascular: Normal rate. Pulmonary/Chest: Effort normal. No respiratory distress.  Abdominal: Musculoskeletal: Normal range of motion. No tenderness. No edema. Tender over left trochanter. Pain when flexing lateral thigh muscles. Full passive ROM of left hip.  Neurological: Pt is alert and oriented to person, place, and time.  Skin: Skin is warm and dry.  Psychiatric: Pt has a normal mood and affect. Pt's behavior is normal.     Assessment & Plan:   Iliotibial band syndrome - Plan: predniSONE (DELTASONE) 20 MG tablet  Hip pain, left - Plan: predniSONE (DELTASONE) 20 MG tablet  Signed, Robyn Haber, MD

## 2013-10-30 ENCOUNTER — Ambulatory Visit: Payer: BC Managed Care – PPO

## 2013-10-30 ENCOUNTER — Ambulatory Visit (INDEPENDENT_AMBULATORY_CARE_PROVIDER_SITE_OTHER): Payer: BC Managed Care – PPO | Admitting: Family Medicine

## 2013-10-30 VITALS — BP 148/80 | HR 75 | Temp 98.3°F | Resp 16 | Ht 64.0 in | Wt 182.0 lb

## 2013-10-30 DIAGNOSIS — M25552 Pain in left hip: Secondary | ICD-10-CM

## 2013-10-30 DIAGNOSIS — M7060 Trochanteric bursitis, unspecified hip: Secondary | ICD-10-CM

## 2013-10-30 DIAGNOSIS — M76899 Other specified enthesopathies of unspecified lower limb, excluding foot: Secondary | ICD-10-CM

## 2013-10-30 DIAGNOSIS — M25559 Pain in unspecified hip: Secondary | ICD-10-CM

## 2013-10-30 MED ORDER — METHYLPREDNISOLONE ACETATE 80 MG/ML IJ SUSP: 40.0000 mg | Freq: Once | INTRAMUSCULAR | Status: DC

## 2013-10-30 NOTE — Progress Notes (Signed)
[facility] [address] [place] [address] [phone]  Date:  10/30/2013   Name:  Charmine Bockrath   DOB:  09/19/1945   MRN:  [id number]  PCP:  Foye Spurling, MD    Chief Complaint: Follow-up   History of Present Illness:  Zenaya Ulatowski is a 68 y.o. very pleasant female patient who presents with the following:  She is here today with pain in her left leg/hip for about 10 days.  She had no known injury.  In fact, she had been resting a lot prior to cominig in a week for her leg due to GI illness.  When she got up and moving again was when she noted the pain.  She was seen here about one week ago for same issue. She took prednisone for 5 days. This did seem to be helping some, but then her course ran out and her sx returned.   The pain runs from her lateral left hip down the left leg  Otherwise she feels well Post- menopausal  Patient Active Problem List   Diagnosis Date Noted  . Cervical dysplasia   . Thyroid disease   . HYPERLIPIDEMIA 09/16/2009    Past Medical History  Diagnosis Date  . Hypothyroidism   . History of cervical dysplasia   . Acute meniscal tear of knee     right knee  . Mild acid reflux     watches diet  . Arthritis     knee    Past Surgical History  Procedure Laterality Date  . Gynecologic cryosurgery  1988    CERVICAL DYSPLASIA  . Tubal ligation  1980  . Knee arthroscopy with medial menisectomy Right 12/28/2012    Procedure: RIGHT KNEE ARTHROSCOPY WITH MEDIAL  MENISECTOMY;  Surgeon: Magnus Sinning, MD;  Location: [facility];  Service: Orthopedics;  Laterality: Right;  . Chondroplasty Right 12/28/2012    Procedure:  SHAVING OF LATERAL AND MEDIAL  FEMORAL CHONDRAL;  Surgeon: Magnus Sinning, MD;  Location: [facility];  Service: Orthopedics;  Laterality: Right;  . Knee arthroscopy with lateral menisectomy Right 12/28/2012    Procedure: KNEE ARTHROSCOPY WITH LATERAL MENISECTOMY;  Surgeon:  Magnus Sinning, MD;  Location: [facility];  Service: Orthopedics;  Laterality: Right;    History  Substance Use Topics  . Smoking status: Never Smoker   . Smokeless tobacco: Never Used  . Alcohol Use: No    Family History  Problem Relation Age of Onset  . Hypertension Mother   . Cancer Sister     COLON  . Colon cancer Sister 74  . Diabetes Brother   . Diabetes Daughter   . Diabetes Son   . Rectal cancer Neg Hx   . Stomach cancer Neg Hx     Allergies  Allergen Reactions  . Penicillins Rash    Medication list has been reviewed and updated.  Current Outpatient Prescriptions on File Prior to Visit  Medication Sig Dispense Refill  . calcium carbonate (OS-CAL) 600 MG TABS tablet Take 600 mg by mouth daily with breakfast.      . diphenhydrAMINE (BENADRYL) 25 MG tablet Take 1 tablet (25 mg total) by mouth every 6 (six) hours.  20 tablet  0  . glucosamine-chondroitin 500-400 MG tablet Take 1 tablet by mouth 2 (two) times daily.      Marland Kitchen levothyroxine (SYNTHROID, LEVOTHROID) 88 MCG tablet Take 88 mcg by mouth daily before breakfast.      .  naproxen sodium (ANAPROX) 220 MG tablet Take 1 tablet (220 mg total) by mouth 2 (two) times daily as needed (for pain).  30 tablet  0  . predniSONE (DELTASONE) 20 MG tablet 2 daily with food  10 tablet  1   No current facility-administered medications on file prior to visit.    Review of Systems:  As per HPI- otherwise negative.   Physical Examination: Filed Vitals:   10/30/13 1618  BP: 148/80  Pulse: 75  Temp: 98.3 F (36.8 C)  Resp: 16   Filed Vitals:   10/30/13 1618  Height: 5\' 4"  (1.626 m)  Weight: 182 lb (82.555 kg)   Body mass index is 31.22 kg/(m^2). Ideal Body Weight: Weight in (lb) to have BMI = 25: 145.3  GEN: WDWN, NAD, Non-toxic, A & O x 3 HEENT: Atraumatic, Normocephalic. Neck supple. No masses, No LAD. Ears and Nose: No external deformity. CV: RRR, No M/G/R. No JVD. No thrill. No extra heart  sounds. PULM: CTA B, no wheezes, crackles, rhonchi. No retractions. No resp. distress. No accessory muscle use. EXTR: No c/c/e NEURO Normal gait.  PSYCH: Normally interactive. Conversant. Not depressed or anxious appearing.  Calm demeanor.  Left hip: she is tender over the left greater trochanter.  Otherwise hip is normal- normal flexion, internal and external rotation.  No redness or fluctuance   [facility] reading (PRIMARY) by  Dr. Lorelei Pont. Left hip: mild degenerative change  VC obtained.  Skin over left greater trochanter prepped with betadine and alcohol.   52ml of 1% lidocaine and 40mg  of depo- medrol injected into left bursa with a 1.5 inch needle.  Pt tolerated procedure well and noted immediate onset of pain relief  Assessment and Plan: Trochanteric bursitis - Plan: DG Hip Complete Left, methylPREDNISolone acetate (DEPO-MEDROL) injection 40 mg  Left hip pain  Injected as above.  Follow-as needed.  Hand- wrote a note for work; light duty for the rest of the week.  Computer was frozen so I could not do in her electronic chart.  She will let me know if any problems.  Avoid NSIAD meds for 5- 7 days   Signed Lamar Blinks, MD

## 2013-10-31 ENCOUNTER — Encounter: Payer: Self-pay | Admitting: Family Medicine

## 2014-04-05 ENCOUNTER — Encounter: Payer: Self-pay | Admitting: Gastroenterology

## 2014-04-10 ENCOUNTER — Encounter: Payer: Self-pay | Admitting: Gastroenterology

## 2014-05-28 ENCOUNTER — Ambulatory Visit (INDEPENDENT_AMBULATORY_CARE_PROVIDER_SITE_OTHER): Payer: BC Managed Care – PPO | Admitting: Gynecology

## 2014-05-28 ENCOUNTER — Encounter: Payer: Self-pay | Admitting: Gynecology

## 2014-05-28 VITALS — BP 130/78 | Ht 65.0 in | Wt 186.0 lb

## 2014-05-28 DIAGNOSIS — N952 Postmenopausal atrophic vaginitis: Secondary | ICD-10-CM

## 2014-05-28 DIAGNOSIS — Z01419 Encounter for gynecological examination (general) (routine) without abnormal findings: Secondary | ICD-10-CM

## 2014-05-28 DIAGNOSIS — Z23 Encounter for immunization: Secondary | ICD-10-CM

## 2014-05-28 NOTE — Patient Instructions (Signed)
You may obtain a copy of any labs that were done today by logging onto MyChart as outlined in the instructions provided with your AVS (after visit summary). The office will not call with normal lab results but certainly if there are any significant abnormalities then we will contact you.   Health Maintenance, Female A healthy lifestyle and preventative care can promote health and wellness.  Maintain regular health, dental, and eye exams.  Eat a healthy diet. Foods like vegetables, fruits, whole grains, low-fat dairy products, and lean protein foods contain the nutrients you need without too many calories. Decrease your intake of foods high in solid fats, added sugars, and salt. Get information about a proper diet from your caregiver, if necessary.  Regular physical exercise is one of the most important things you can do for your health. Most adults should get at least 150 minutes of moderate-intensity exercise (any activity that increases your heart rate and causes you to sweat) each week. In addition, most adults need muscle-strengthening exercises on 2 or more days a week.   Maintain a healthy weight. The body mass index (BMI) is a screening tool to identify possible weight problems. It provides an estimate of body fat based on height and weight. Your caregiver can help determine your BMI, and can help you achieve or maintain a healthy weight. For adults 20 years and older:  A BMI below 18.5 is considered underweight.  A BMI of 18.5 to 24.9 is normal.  A BMI of 25 to 29.9 is considered overweight.  A BMI of 30 and above is considered obese.  Maintain normal blood lipids and cholesterol by exercising and minimizing your intake of saturated fat. Eat a balanced diet with plenty of fruits and vegetables. Blood tests for lipids and cholesterol should begin at age 61 and be repeated every 5 years. If your lipid or cholesterol levels are high, you are over 50, or you are a high risk for heart  disease, you may need your cholesterol levels checked more frequently.Ongoing high lipid and cholesterol levels should be treated with medicines if diet and exercise are not effective.  If you smoke, find out from your caregiver how to quit. If you do not use tobacco, do not start.  Lung cancer screening is recommended for adults aged 33 80 years who are at high risk for developing lung cancer because of a history of smoking. Yearly low-dose computed tomography (CT) is recommended for people who have at least a 30-pack-year history of smoking and are a current smoker or have quit within the past 15 years. A pack year of smoking is smoking an average of 1 pack of cigarettes a day for 1 year (for example: 1 pack a day for 30 years or 2 packs a day for 15 years). Yearly screening should continue until the smoker has stopped smoking for at least 15 years. Yearly screening should also be stopped for people who develop a health problem that would prevent them from having lung cancer treatment.  If you are pregnant, do not drink alcohol. If you are breastfeeding, be very cautious about drinking alcohol. If you are not pregnant and choose to drink alcohol, do not exceed 1 drink per day. One drink is considered to be 12 ounces (355 mL) of beer, 5 ounces (148 mL) of wine, or 1.5 ounces (44 mL) of liquor.  Avoid use of street drugs. Do not share needles with anyone. Ask for help if you need support or instructions about stopping  the use of drugs.  High blood pressure causes heart disease and increases the risk of stroke. Blood pressure should be checked at least every 1 to 2 years. Ongoing high blood pressure should be treated with medicines, if weight loss and exercise are not effective.  If you are 59 to 69 years old, ask your caregiver if you should take aspirin to prevent strokes.  Diabetes screening involves taking a blood sample to check your fasting blood sugar level. This should be done once every 3  years, after age 91, if you are within normal weight and without risk factors for diabetes. Testing should be considered at a younger age or be carried out more frequently if you are overweight and have at least 1 risk factor for diabetes.  Breast cancer screening is essential preventative care for women. You should practice "breast self-awareness." This means understanding the normal appearance and feel of your breasts and may include breast self-examination. Any changes detected, no matter how small, should be reported to a caregiver. Women in their 66s and 30s should have a clinical breast exam (CBE) by a caregiver as part of a regular health exam every 1 to 3 years. After age 101, women should have a CBE every year. Starting at age 100, women should consider having a mammogram (breast X-ray) every year. Women who have a family history of breast cancer should talk to their caregiver about genetic screening. Women at a high risk of breast cancer should talk to their caregiver about having an MRI and a mammogram every year.  Breast cancer gene (BRCA)-related cancer risk assessment is recommended for women who have family members with BRCA-related cancers. BRCA-related cancers include breast, ovarian, tubal, and peritoneal cancers. Having family members with these cancers may be associated with an increased risk for harmful changes (mutations) in the breast cancer genes BRCA1 and BRCA2. Results of the assessment will determine the need for genetic counseling and BRCA1 and BRCA2 testing.  The Pap test is a screening test for cervical cancer. Women should have a Pap test starting at age 57. Between ages 25 and 35, Pap tests should be repeated every 2 years. Beginning at age 37, you should have a Pap test every 3 years as long as the past 3 Pap tests have been normal. If you had a hysterectomy for a problem that was not cancer or a condition that could lead to cancer, then you no longer need Pap tests. If you are  between ages 50 and 76, and you have had normal Pap tests going back 10 years, you no longer need Pap tests. If you have had past treatment for cervical cancer or a condition that could lead to cancer, you need Pap tests and screening for cancer for at least 20 years after your treatment. If Pap tests have been discontinued, risk factors (such as a new sexual partner) need to be reassessed to determine if screening should be resumed. Some women have medical problems that increase the chance of getting cervical cancer. In these cases, your caregiver may recommend more frequent screening and Pap tests.  The human papillomavirus (HPV) test is an additional test that may be used for cervical cancer screening. The HPV test looks for the virus that can cause the cell changes on the cervix. The cells collected during the Pap test can be tested for HPV. The HPV test could be used to screen women aged 44 years and older, and should be used in women of any age  who have unclear Pap test results. After the age of 30, women should have HPV testing at the same frequency as a Pap test.  Colorectal cancer can be detected and often prevented. Most routine colorectal cancer screening begins at the age of 50 and continues through age 75. However, your caregiver may recommend screening at an earlier age if you have risk factors for colon cancer. On a yearly basis, your caregiver may provide home test kits to check for hidden blood in the stool. Use of a small camera at the end of a tube, to directly examine the colon (sigmoidoscopy or colonoscopy), can detect the earliest forms of colorectal cancer. Talk to your caregiver about this at age 50, when routine screening begins. Direct examination of the colon should be repeated every 5 to 10 years through age 75, unless early forms of pre-cancerous polyps or small growths are found.  Hepatitis C blood testing is recommended for all people born from 1945 through 1965 and any  individual with known risks for hepatitis C.  Practice safe sex. Use condoms and avoid high-risk sexual practices to reduce the spread of sexually transmitted infections (STIs). Sexually active women aged 25 and younger should be checked for Chlamydia, which is a common sexually transmitted infection. Older women with new or multiple partners should also be tested for Chlamydia. Testing for other STIs is recommended if you are sexually active and at increased risk.  Osteoporosis is a disease in which the bones lose minerals and strength with aging. This can result in serious bone fractures. The risk of osteoporosis can be identified using a bone density scan. Women ages 65 and over and women at risk for fractures or osteoporosis should discuss screening with their caregivers. Ask your caregiver whether you should be taking a calcium supplement or vitamin D to reduce the rate of osteoporosis.  Menopause can be associated with physical symptoms and risks. Hormone replacement therapy is available to decrease symptoms and risks. You should talk to your caregiver about whether hormone replacement therapy is right for you.  Use sunscreen. Apply sunscreen liberally and repeatedly throughout the day. You should seek shade when your shadow is shorter than you. Protect yourself by wearing long sleeves, pants, a wide-brimmed hat, and sunglasses year round, whenever you are outdoors.  Notify your caregiver of new moles or changes in moles, especially if there is a change in shape or color. Also notify your caregiver if a mole is larger than the size of a pencil eraser.  Stay current with your immunizations. Document Released: 02/09/2011 Document Revised: 11/21/2012 Document Reviewed: 02/09/2011 [employer] Patient Information 2014 [employer].   

## 2014-05-28 NOTE — Addendum Note (Signed)
Addended by: Nelva Nay on: 05/28/2014 12:20 PM   Modules accepted: Orders

## 2014-05-28 NOTE — Progress Notes (Signed)
Cynthia Bowers May 10, 1946 [id number]        68 y.o.  G3P3003 for annual exam.  Doing well without complaints.  Past medical history,surgical history, problem list, medications, allergies, family history and social history were all reviewed and documented as reviewed in the EPIC chart.  ROS:  12 system ROS performed with pertinent positives and negatives included in the history, assessment and plan.   Additional significant findings :  none   Exam: [name] Vitals:   05/28/14 1049  BP: 130/78  Height: 5\' 5"  (1.651 m)  Weight: 186 lb (84.369 kg)   General appearance:  Normal affect, orientation and appearance. Skin: Grossly normal HEENT: Without gross lesions.  No cervical or supraclavicular adenopathy. Thyroid normal.  Lungs:  Clear without wheezing, rales or rhonchi Cardiac: RR, without RMG Abdominal:  Soft, nontender, without masses, guarding, rebound, organomegaly or hernia Breasts:  Examined lying and sitting without masses, retractions, discharge or axillary adenopathy. Pelvic:  Ext/BUS/vagina with generalized atrophic changes.  Cervix atrophic  Uterus anteverted, normal size, shape and contour, midline and mobile nontender   Adnexa  Without masses or tenderness    Anus and perineum  Normal   Rectovaginal  Normal sphincter tone without palpated masses or tenderness.    Assessment/Plan:  68 y.o. G3P3003 female for annual exam.   1. Postmenopausal/atrophic genital changes. Without significant symptoms of hot flushes, night sweats, vaginal dryness. Is not sexually active. No vaginal bleeding. Continue to monitor. Report any vaginal bleeding. 2. Pap smear 2013. No Pap smear done today. History of cryosurgery 1988 with normal Pap smears since then. Discussed current screening guidelines and options to stop screening versus less frequent screening intervals reviewed. Will readdress on an annual basis. 3. Mammography coming due this November and I reminded her to  schedule this. SBE monthly reviewed. 4. DEXA 2007 normal. Plan repeat next year at age 67. Increased calcium vitamin D reviewed. 5. Colonoscopy 2012.  Repeat at their recommended interval. 6. Health maintenance. No routine blood work done as she reports this done at her primary physician's office. Follow up one year, sooner as needed.     Anastasio Auerbach MD, 11:16 AM 05/28/2014

## 2014-05-29 LAB — URINALYSIS W MICROSCOPIC + REFLEX CULTURE
Bacteria, UA: NONE SEEN
Bilirubin Urine: NEGATIVE
Casts: NONE SEEN
Crystals: NONE SEEN
Glucose, UA: NEGATIVE mg/dL
Hgb urine dipstick: NEGATIVE
Ketones, ur: NEGATIVE mg/dL
LEUKOCYTES UA: NEGATIVE
NITRITE: NEGATIVE
PROTEIN: NEGATIVE mg/dL
SQUAMOUS EPITHELIAL / LPF: NONE SEEN
Specific Gravity, Urine: 1.011 (ref 1.005–1.030)
UROBILINOGEN UA: 0.2 mg/dL (ref 0.0–1.0)
pH: 6.5 (ref 5.0–8.0)

## 2014-06-04 ENCOUNTER — Ambulatory Visit (AMBULATORY_SURGERY_CENTER): Payer: Self-pay | Admitting: *Deleted

## 2014-06-04 VITALS — Ht 65.0 in | Wt 188.0 lb

## 2014-06-04 DIAGNOSIS — Z86010 Personal history of colonic polyps: Secondary | ICD-10-CM

## 2014-06-04 MED ORDER — MOVIPREP 100 G PO SOLR: ORAL | Status: DC

## 2014-06-04 NOTE — Progress Notes (Signed)
Patient denies any allergies to eggs or soy. Patient denies any problems with anesthesia/sedation. Patient denies any oxygen use at home and does not take any diet/weight loss medications. EMMI education assisgned to patient on colonoscopy, this was explained and instructions given to patient. 

## 2014-06-19 ENCOUNTER — Encounter: Payer: Self-pay | Admitting: Gastroenterology

## 2014-06-19 ENCOUNTER — Ambulatory Visit (AMBULATORY_SURGERY_CENTER): Payer: BC Managed Care – PPO | Admitting: Gastroenterology

## 2014-06-19 VITALS — BP 133/75 | HR 66 | Temp 97.4°F | Resp 13 | Ht 65.0 in | Wt 188.0 lb

## 2014-06-19 DIAGNOSIS — D122 Benign neoplasm of ascending colon: Secondary | ICD-10-CM

## 2014-06-19 DIAGNOSIS — D123 Benign neoplasm of transverse colon: Secondary | ICD-10-CM

## 2014-06-19 DIAGNOSIS — Z800 Family history of malignant neoplasm of digestive organs: Secondary | ICD-10-CM

## 2014-06-19 DIAGNOSIS — Z86010 Personal history of colonic polyps: Secondary | ICD-10-CM

## 2014-06-19 MED ORDER — SODIUM CHLORIDE 0.9 % IV SOLN: 500.0000 mL | INTRAVENOUS | Status: DC

## 2014-06-19 NOTE — Progress Notes (Signed)
Procedure ends, to recovery, report given and VSS. 

## 2014-06-19 NOTE — Op Note (Signed)
[facility]  [address] [place], [address]   COLONOSCOPY PROCEDURE REPORT PATIENT: Cynthia, Bowers  MR#: [id number] BIRTHDATE: 04/10/1946 , 68  yrs. old GENDER: female ENDOSCOPIST: Ladene Artist, MD, [facility] PROCEDURE DATE:  06/19/2014 PROCEDURE:   Colonoscopy with snare polypectomy First Screening Colonoscopy - Avg.  risk and is 50 yrs.  old or older - No.  Prior Negative Screening - Now for repeat screening. N/A  History of Adenoma - Now for follow-up colonoscopy & has been > or = to 3 yrs.  Yes hx of adenoma.  Has been 3 or more years since last colonoscopy.  Polyps Removed Today? Yes. ASA CLASS:   Class II INDICATIONS:surveillance colonoscopy based on a history of adenomatous colonic polyp(s) and patient's immediate family history of colon cancer. MEDICATIONS: Monitored anesthesia care and Propofol 260 mg IV DESCRIPTION OF PROCEDURE:   After the risks benefits and alternatives of the procedure were thoroughly explained, informed consent was obtained.  The digital rectal exam revealed no abnormalities of the rectum.   The LB [id number]  endoscope was introduced through the anus and advanced to the cecum, which was identified by both the appendix and ileocecal valve. No adverse events experienced.   The quality of the prep was good, using MoviPrep  The instrument was then slowly withdrawn as the colon was fully examined.  COLON FINDINGS: Two sessile polyps measuring 6 mm in size were found in the transverse colon and ascending colon.  A polypectomy was performed with a cold snare.  The resection was complete, the polyp tissue was completely retrieved and sent to histology.   A sessile polyp measuring 8 mm in size was found in the ascending colon.  A polypectomy was performed using snare cautery.  The resection was complete, the polyp tissue was completely retrieved and sent to histology.   There was mild diverticulosis noted in the sigmoid colon.    The examination was otherwise normal.  Retroflexed views revealed internal Grade I hemorrhoids. The time to cecum=3 minutes 47 seconds.  Withdrawal time=12 minutes 01 seconds.  The scope was withdrawn and the procedure completed. COMPLICATIONS: There were no immediate complications.  ENDOSCOPIC IMPRESSION: 1.   Two sessile polyps in the transverse colon and ascending colon; polypectomy performed with a cold snare 2.   Sessile polyp in the ascending colon; polypectomy performed using snare cautery 3.   Mild diverticulosis  in the sigmoid colon 4.   Grade l internal hemorrhoids  RECOMMENDATIONS: 1.  Await pathology results 2.  Hold Aspirin and all other NSAIDS for 2 weeks. 3.  High fiber diet with liberal fluid intake. 4.  Repeat Colonoscopy in 5 years.  eSigned:  Ladene Artist, MD, [facility] 06/19/2014 9:46 AM

## 2014-06-19 NOTE — Progress Notes (Signed)
Called to room to assist during endoscopic procedure.  Patient ID and intended procedure confirmed with present staff. Received instructions for my participation in the procedure from the performing physician.  

## 2014-06-19 NOTE — Patient Instructions (Signed)

## 2014-06-20 ENCOUNTER — Telehealth: Payer: Self-pay

## 2014-06-20 NOTE — Telephone Encounter (Signed)
  Follow up Call-  Call back number 06/19/2014  Post procedure Call Back phone  # [phone]  Permission to leave phone message Yes     Patient questions:  Do you have a fever, pain , or abdominal swelling? No. Pain Score  0 *  Have you tolerated food without any problems? Yes.    Have you been able to return to your normal activities? Yes.    Do you have any questions about your discharge instructions: Diet   No. Medications  No. Follow up visit  No.  Do you have questions or concerns about your Care? No.  Actions: * If pain score is 4 or above: No action needed, pain <4.

## 2014-06-26 ENCOUNTER — Encounter: Payer: Self-pay | Admitting: Gastroenterology

## 2014-07-02 ENCOUNTER — Encounter: Payer: Self-pay | Admitting: Gynecology

## 2014-07-12 ENCOUNTER — Ambulatory Visit (INDEPENDENT_AMBULATORY_CARE_PROVIDER_SITE_OTHER): Payer: BC Managed Care – PPO

## 2014-07-12 ENCOUNTER — Ambulatory Visit (INDEPENDENT_AMBULATORY_CARE_PROVIDER_SITE_OTHER): Payer: BC Managed Care – PPO | Admitting: Family Medicine

## 2014-07-12 VITALS — BP 152/84 | HR 80 | Temp 98.4°F | Resp 18 | Ht 64.0 in | Wt 184.2 lb

## 2014-07-12 DIAGNOSIS — M79641 Pain in right hand: Secondary | ICD-10-CM

## 2014-07-12 DIAGNOSIS — M25561 Pain in right knee: Secondary | ICD-10-CM

## 2014-07-12 DIAGNOSIS — M1711 Unilateral primary osteoarthritis, right knee: Secondary | ICD-10-CM

## 2014-07-12 DIAGNOSIS — M179 Osteoarthritis of knee, unspecified: Secondary | ICD-10-CM

## 2014-07-12 MED ORDER — MELOXICAM 7.5 MG PO TABS: 7.5000 mg | ORAL_TABLET | Freq: Every day | ORAL | Status: DC

## 2014-07-12 NOTE — Progress Notes (Signed)
Subjective:    Patient ID: Cynthia Bowers, female    DOB: [birth year], 68 y.o.   MRN: [id number]  HPI  This is a 68 year old female presenting with right knee pain x 1 week and right hand pain x 1 month  Right knee pain: Been going on for 1 week. She cannot recall an injury. She was not more active than normal before the pain began. She describes the pain as soreness and worse with movement. She reports bending her knee hurts and feels tight. She has had swelling of her medial knee. She has not tried anything for the pain. 1.5 years ago she had an arthroscopy of her right knee d/t injury and had a lateral and medial meniscectomy. She states she was told after the procedure that she had some arthritis. She recovered well from that surgery and does not have knee pain at baseline. 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 She has 6 more months until she retires. She went to [facility] for her surgery. She has never had a corticosteroid injection in her knees, but has had one before in her hip.  Right hand pain: Been going on for 1 month. The pain is located to her 5th MCP. She describes the pain as soreness and only hurts with movement, especially with gripping her hand. She has not noticed any swelling or bruising. She has not tried anything for the pain. She has never had problems with hand pain before. She hypothesizes it is from gripping brooms/mops most of the day.  Review of Systems  Constitutional: Negative for fever and chills.  Musculoskeletal: Positive for joint swelling, arthralgias and gait problem.  Skin: Negative for color change, rash and wound.  Neurological: Negative for numbness.  Psychiatric/Behavioral: Negative for sleep disturbance.    Patient Active Problem List   Diagnosis Date Noted  . Cervical dysplasia   . Thyroid disease   . HYPERLIPIDEMIA 09/16/2009   Prior to Admission medications   Medication Sig Start Date End Date Taking?  Authorizing Provider  aspirin 81 MG tablet Take 81 mg by mouth daily.   Yes Historical Provider, MD  calcium carbonate (OS-CAL) 600 MG TABS tablet Take 600 mg by mouth daily with breakfast.   Yes Historical Provider, MD  diphenhydrAMINE (BENADRYL) 25 MG tablet Take 1 tablet (25 mg total) by mouth every 6 (six) hours. 03/29/13  Yes Dorie Rank, MD  levothyroxine (SYNTHROID, LEVOTHROID) 88 MCG tablet Take 88 mcg by mouth daily before breakfast.   Yes Historical Provider, MD  Multiple Vitamin (MULTIVITAMIN) tablet Take 1 tablet by mouth daily.   Yes Historical Provider, MD   Allergies  Allergen Reactions  . Penicillins Rash   Patient's social and family history were reviewed.     Objective:   Physical Exam  Constitutional: She is oriented to person, place, and time. She appears well-developed and well-nourished. No distress.  HENT:  Head: Normocephalic and atraumatic.  Right Ear: Hearing normal.  Left Ear: Hearing normal.  Nose: Nose normal.  Eyes: Conjunctivae and lids are normal. Right eye exhibits no discharge. Left eye exhibits no discharge. No scleral icterus.  Cardiovascular: Normal rate, regular rhythm, normal heart sounds, intact distal pulses and normal pulses.   Pulmonary/Chest: Effort normal and breath sounds normal. No respiratory distress. She has no wheezes. She has no rhonchi. She has no rales.  Musculoskeletal: Normal range of motion.       Right knee: She exhibits  swelling (swelling medial>lateral knee) and effusion. She exhibits normal range of motion, no ecchymosis, no erythema, no LCL laxity, normal patellar mobility, normal meniscus and no MCL laxity. Tenderness found. Medial joint line tenderness noted. No lateral joint line, no MCL, no LCL and no patellar tendon tenderness noted.       Right hand: She exhibits tenderness (5th MCP) and bony tenderness. She exhibits normal range of motion, normal capillary refill, no deformity and no swelling. Normal sensation noted. Normal  strength noted.  McMurrays negative    Neurological: She is alert and oriented to person, place, and time. She has normal strength. No sensory deficit.  Skin: Skin is warm, dry and intact. No lesion and no rash noted.  Psychiatric: She has a normal mood and affect. Her speech is normal and behavior is normal. Thought content normal.   [facility] reading (PRIMARY) by  Dr. Brigitte Pulse: right hand and right knee radiographs negative for acute bony abnormality. Knee radiograph shows medial joint narrowing and osteophyte formation.     Assessment & Plan:  1. Hand pain, right 2. Right knee pain 3. Osteoarthritis of right knee Pt has osteoarthritis of her right knee, as evidenced by knee radiograph. She likely aggravated the knee last week. She was fit for a hinge knee brace to wear when active. She was prescribed a 2 week course of mobic to decrease inflammation - will hopefully help hand pain as well. She has been seen at [facility] before - she will make appt with them if she continues to have problems, may consider corticosteroid injections.  - DG Hand Complete Right; Future - DG Knee Complete 4 Views Right; Future - meloxicam (MOBIC) 7.5 MG tablet; Take 1 tablet (7.5 mg total) by mouth daily.  Dispense: 14 tablet; Refill: 0   Benjaman Pott. Drenda Freeze, MHS [facility]  07/13/2014

## 2014-07-12 NOTE — Patient Instructions (Signed)
Call [facility] if this does not get better - they can give you a steroid injection Take meloxicam once daily for 10 days Wear knee brace especially when you are up and about. Osteoarthritis Osteoarthritis is a disease that causes soreness and inflammation of a joint. It occurs when the cartilage at the affected joint wears down. Cartilage acts as a cushion, covering the ends of bones where they meet to form a joint. Osteoarthritis is the most common form of arthritis. It often occurs in older people. The joints affected most often by this condition include those in the:  Ends of the fingers.  Thumbs.  Neck.  Lower back.  Knees.  Hips. CAUSES  Over time, the cartilage that covers the ends of bones begins to wear away. This causes bone to rub on bone, producing pain and stiffness in the affected joints.  RISK FACTORS Certain factors can increase your chances of having osteoarthritis, including:  Older age.  Excessive body weight.  Overuse of joints.  Previous joint injury. SIGNS AND SYMPTOMS   Pain, swelling, and stiffness in the joint.  Over time, the joint may lose its normal shape.  Small deposits of bone (osteophytes) may grow on the edges of the joint.  Bits of bone or cartilage can break off and float inside the joint space. This may cause more pain and damage. DIAGNOSIS  Your health care provider will do a physical exam and ask about your symptoms. Various tests may be ordered, such as:  X-rays of the affected joint.  An MRI scan.  Blood tests to rule out other types of arthritis.  Joint fluid tests. This involves using a needle to draw fluid from the joint and examining the fluid under a microscope. TREATMENT  Goals of treatment are to control pain and improve joint function. Treatment plans may include:  A prescribed exercise program that allows for rest and joint relief.  A weight control plan.  Pain relief techniques, such as:  Properly  applied heat and cold.  Electric pulses delivered to nerve endings under the skin (transcutaneous electrical nerve stimulation [TENS]).  Massage.  Certain nutritional supplements.  Medicines to control pain, such as:  Acetaminophen.  Nonsteroidal anti-inflammatory drugs (NSAIDs), such as naproxen.  Narcotic or central-acting agents, such as tramadol.  Corticosteroids. These can be given orally or as an injection.  Surgery to reposition the bones and relieve pain (osteotomy) or to remove loose pieces of bone and cartilage. Joint replacement may be needed in advanced states of osteoarthritis. HOME CARE INSTRUCTIONS   Take medicines only as directed by your health care provider.  Maintain a healthy weight. Follow your health care provider's instructions for weight control. This may include dietary instructions.  Exercise as directed. Your health care provider can recommend specific types of exercise. These may include:  Strengthening exercises. These are done to strengthen the muscles that support joints affected by arthritis. They can be performed with weights or with exercise bands to add resistance.  Aerobic activities. These are exercises, such as brisk walking or low-impact aerobics, that get your heart pumping.  Range-of-motion activities. These keep your joints limber.  Balance and agility exercises. These help you maintain daily living skills.  Rest your affected joints as directed by your health care provider.  Keep all follow-up visits as directed by your health care provider. SEEK MEDICAL CARE IF:   Your skin turns red.  You develop a rash in addition to your joint pain.  You have worsening joint  pain.  You have a fever along with joint or muscle aches. SEEK IMMEDIATE MEDICAL CARE IF:  You have a significant loss of weight or appetite.  You have night sweats. [place] of Arthritis and Musculoskeletal and Skin Diseases:  www.niams.SouthExposed.es  [employer] on Aging: http://kim-miller.com/  American College of Rheumatology: www.rheumatology.org Document Released: 07/27/2005 Document Revised: 12/11/2013 Document Reviewed: 04/03/2013 [facility] Patient Information 2015 [place], [place]. This information is not intended to replace advice given to you by your health care provider. Make sure you discuss any questions you have with your health care provider.

## 2014-07-13 DIAGNOSIS — M1711 Unilateral primary osteoarthritis, right knee: Secondary | ICD-10-CM | POA: Insufficient documentation

## 2014-08-01 ENCOUNTER — Ambulatory Visit (INDEPENDENT_AMBULATORY_CARE_PROVIDER_SITE_OTHER): Payer: BC Managed Care – PPO

## 2014-08-01 ENCOUNTER — Ambulatory Visit (INDEPENDENT_AMBULATORY_CARE_PROVIDER_SITE_OTHER): Payer: BC Managed Care – PPO | Admitting: Emergency Medicine

## 2014-08-01 ENCOUNTER — Encounter (HOSPITAL_COMMUNITY): Payer: Self-pay | Admitting: Emergency Medicine

## 2014-08-01 ENCOUNTER — Emergency Department (HOSPITAL_COMMUNITY): Payer: BC Managed Care – PPO

## 2014-08-01 ENCOUNTER — Emergency Department (HOSPITAL_COMMUNITY)
Admission: EM | Admit: 2014-08-01 | Discharge: 2014-08-01 | Disposition: A | Discharge status: Home / Self Care | Payer: BC Managed Care – PPO | Attending: Emergency Medicine | Admitting: Emergency Medicine

## 2014-08-01 VITALS — BP 140/78 | HR 69 | Temp 98.6°F | Resp 16 | Ht 65.5 in | Wt 183.6 lb

## 2014-08-01 DIAGNOSIS — K219 Gastro-esophageal reflux disease without esophagitis: Secondary | ICD-10-CM | POA: Diagnosis not present

## 2014-08-01 DIAGNOSIS — R0781 Pleurodynia: Secondary | ICD-10-CM

## 2014-08-01 DIAGNOSIS — M179 Osteoarthritis of knee, unspecified: Secondary | ICD-10-CM | POA: Diagnosis not present

## 2014-08-01 DIAGNOSIS — R630 Anorexia: Secondary | ICD-10-CM | POA: Diagnosis not present

## 2014-08-01 DIAGNOSIS — M7918 Myalgia, other site: Secondary | ICD-10-CM

## 2014-08-01 DIAGNOSIS — Z7982 Long term (current) use of aspirin: Secondary | ICD-10-CM | POA: Diagnosis not present

## 2014-08-01 DIAGNOSIS — Z9851 Tubal ligation status: Secondary | ICD-10-CM | POA: Diagnosis not present

## 2014-08-01 DIAGNOSIS — Z87410 Personal history of cervical dysplasia: Secondary | ICD-10-CM | POA: Insufficient documentation

## 2014-08-01 DIAGNOSIS — K8020 Calculus of gallbladder without cholecystitis without obstruction: Secondary | ICD-10-CM | POA: Diagnosis not present

## 2014-08-01 DIAGNOSIS — Z880 Allergy status to penicillin: Secondary | ICD-10-CM | POA: Diagnosis not present

## 2014-08-01 DIAGNOSIS — M791 Myalgia: Secondary | ICD-10-CM | POA: Diagnosis not present

## 2014-08-01 DIAGNOSIS — R109 Unspecified abdominal pain: Secondary | ICD-10-CM | POA: Diagnosis present

## 2014-08-01 DIAGNOSIS — R1011 Right upper quadrant pain: Secondary | ICD-10-CM

## 2014-08-01 DIAGNOSIS — E039 Hypothyroidism, unspecified: Secondary | ICD-10-CM | POA: Insufficient documentation

## 2014-08-01 LAB — COMPREHENSIVE METABOLIC PANEL
ALT: 18 U/L (ref 0–35)
AST: 28 U/L (ref 0–37)
Albumin: 4.7 g/dL (ref 3.5–5.2)
Alkaline Phosphatase: 61 U/L (ref 39–117)
Anion gap: 6 (ref 5–15)
BUN: 16 mg/dL (ref 6–23)
CO2: 31 mmol/L (ref 19–32)
CREATININE: 0.86 mg/dL (ref 0.50–1.10)
Calcium: 9.6 mg/dL (ref 8.4–10.5)
Chloride: 103 mEq/L (ref 96–112)
GFR calc Af Amer: 78 mL/min — ABNORMAL LOW (ref >90)
GFR, EST NON AFRICAN AMERICAN: 67 mL/min — AB (ref >90)
Glucose, Bld: 118 mg/dL — ABNORMAL HIGH (ref 70–99)
Potassium: 4.3 mmol/L (ref 3.5–5.1)
Sodium: 140 mmol/L (ref 135–145)
Total Bilirubin: 1 mg/dL (ref 0.3–1.2)
Total Protein: 8.2 g/dL (ref 6.0–8.3)

## 2014-08-01 LAB — POCT CBC
Granulocyte percent: 72 %G (ref 37–80)
HCT, POC: 39.6 % (ref 37.7–47.9)
Hemoglobin: 12.9 g/dL (ref 12.2–16.2)
Lymph, poc: 1.3 (ref 0.6–3.4)
MCH, POC: 30.3 pg (ref 27–31.2)
MCHC: 32.5 g/dL (ref 31.8–35.4)
MCV: 93.1 fL (ref 80–97)
MID (cbc): 0.3 (ref 0–0.9)
MPV: 7.8 fL (ref 0–99.8)
POC GRANULOCYTE: 4 (ref 2–6.9)
POC LYMPH %: 22.5 % (ref 10–50)
POC MID %: 5.5 % (ref 0–12)
Platelet Count, POC: 252 10*3/uL (ref 142–424)
RBC: 4.26 M/uL (ref 4.04–5.48)
RDW, POC: 13.5 %
WBC: 5.6 10*3/uL (ref 4.6–10.2)

## 2014-08-01 LAB — POCT URINALYSIS DIPSTICK
Bilirubin, UA: NEGATIVE
Glucose, UA: NEGATIVE
KETONES UA: NEGATIVE
Leukocytes, UA: NEGATIVE
NITRITE UA: NEGATIVE
PH UA: 8.5
Protein, UA: 30
RBC UA: NEGATIVE
Spec Grav, UA: 1.015
Urobilinogen, UA: 1

## 2014-08-01 LAB — POCT UA - MICROSCOPIC ONLY
CASTS, UR, LPF, POC: NEGATIVE
CRYSTALS, UR, HPF, POC: NEGATIVE
MUCUS UA: NEGATIVE
RBC, urine, microscopic: NEGATIVE
YEAST UA: NEGATIVE

## 2014-08-01 LAB — D-DIMER, QUANTITATIVE (NOT AT ARMC): D DIMER QUANT: 0.4 ug{FEU}/mL (ref 0.00–0.48)

## 2014-08-01 LAB — I-STAT TROPONIN, ED: TROPONIN I, POC: 0 ng/mL (ref 0.00–0.08)

## 2014-08-01 LAB — LIPASE, BLOOD: LIPASE: 17 U/L (ref 11–59)

## 2014-08-01 MED ORDER — ONDANSETRON HCL 4 MG/2ML IJ SOLN
4.0000 mg | Freq: Once | INTRAMUSCULAR | Status: AC
  Administered 2014-08-01: 4 mg via INTRAVENOUS
  Filled 2014-08-01: qty 2

## 2014-08-01 MED ORDER — ONDANSETRON HCL 4 MG PO TABS: 4.0000 mg | ORAL_TABLET | Freq: Four times a day (QID) | ORAL | Status: DC

## 2014-08-01 MED ORDER — HYDROCODONE-ACETAMINOPHEN 5-325 MG PO TABS: 1.0000 | ORAL_TABLET | Freq: Four times a day (QID) | ORAL | Status: DC | PRN

## 2014-08-01 MED ORDER — SODIUM CHLORIDE 0.9 % IV BOLUS (SEPSIS): 1000.0000 mL | Freq: Once | INTRAVENOUS | Status: DC

## 2014-08-01 MED ORDER — MORPHINE SULFATE 4 MG/ML IJ SOLN
4.0000 mg | Freq: Once | INTRAMUSCULAR | Status: AC
  Administered 2014-08-01: 4 mg via INTRAVENOUS
  Filled 2014-08-01: qty 1

## 2014-08-01 NOTE — Progress Notes (Deleted)
   Subjective:    Patient ID: Cynthia Bowers, female    DOB: 1946-07-21, 68 y.o.   MRN: [id number]  HPI    Review of Systems     Objective:   Physical Exam        Assessment & Plan:

## 2014-08-01 NOTE — Patient Instructions (Signed)
Cholelithiasis °Cholelithiasis (also called gallstones) is a form of gallbladder disease in which gallstones form in your gallbladder. The gallbladder is an organ that stores bile made in the liver, which helps digest fats. Gallstones begin as small crystals and slowly grow into stones. Gallstone pain occurs when the gallbladder spasms and a gallstone is blocking the duct. Pain can also occur when a stone passes out of the duct.  °RISK FACTORS °· Being female.   °· Having multiple pregnancies. Health care providers sometimes advise removing diseased gallbladders before future pregnancies.   °· Being obese. °· Eating a diet heavy in fried foods and fat.   °· Being older than 60 years and increasing age.   °· Prolonged use of medicines containing female hormones.   °· Having diabetes mellitus.   °· Rapidly losing weight.   °· Having a family history of gallstones (heredity).   °SYMPTOMS °· Nausea.   °· Vomiting. °· Abdominal pain.   °· Yellowing of the skin (jaundice).   °· Sudden pain. It may persist from several minutes to several hours. °· Fever.   °· Tenderness to the touch.  °In some cases, when gallstones do not move into the bile duct, people have no pain or symptoms. These are called "silent" gallstones.  °TREATMENT °Silent gallstones do not need treatment. In severe cases, emergency surgery may be required. Options for treatment include: °· Surgery to remove the gallbladder. This is the most common treatment. °· Medicines. These do not always work and may take 6-12 months or more to work. °· Shock wave treatment (extracorporeal biliary lithotripsy). In this treatment an ultrasound machine sends shock waves to the gallbladder to break gallstones into smaller pieces that can pass into the intestines or be dissolved by medicine. °HOME CARE INSTRUCTIONS  °· Only take over-the-counter or prescription medicines for pain, discomfort, or fever as directed by your health care provider.   °· Follow a low-fat diet until  seen again by your health care provider. Fat causes the gallbladder to contract, which can result in pain.   °· Follow up with your health care provider as directed. Attacks are almost always recurrent and surgery is usually required for permanent treatment.   °SEEK IMMEDIATE MEDICAL CARE IF:  °· Your pain increases and is not controlled by medicines.   °· You have a fever or persistent symptoms for more than 2-3 days.   °· You have a fever and your symptoms suddenly get worse.   °· You have persistent nausea and vomiting.   °MAKE SURE YOU:  °· Understand these instructions. °· Will watch your condition. °· Will get help right away if you are not doing well or get worse. °Document Released: 07/23/2005 Document Revised: 03/29/2013 Document Reviewed: 01/18/2013 °ExitCare® Patient Information ©2015 [employer]. This information is not intended to replace advice given to you by your health care provider. Make sure you discuss any questions you have with your health care provider. ° °

## 2014-08-01 NOTE — ED Notes (Signed)
Pt being sent over by Dr Everlene Farrier. RUQ pain since Friday. Images show pt has gallbladder full of stones. Radiologist put exam impression as normal, but commented on gall stones. Dr Everlene Farrier calling radiologist to have impression fixed. Dr Everlene Farrier has called general surgery to give a heads up.

## 2014-08-01 NOTE — ED Provider Notes (Addendum)
CSN: [id number]     Arrival date & time 08/01/14  1242 History   First MD Initiated Contact with Patient 08/01/14 1248     Chief Complaint  Patient presents with  . Abdominal Pain     (Consider location/radiation/quality/duration/timing/severity/associated sxs/prior Treatment) HPI Comments: Pt sent from urgent care with right side pain that is getting worse.  Exacerbated by movement.  cxr at urgent care showed gallstones and pt sent here for evaluation  Patient is a 68 y.o. female presenting with abdominal pain. The history is provided by the patient.  Abdominal Pain Pain location: right side pain. Pain quality: sharp and squeezing   Pain radiates to:  R shoulder Pain severity:  Moderate Onset quality:  Gradual Duration:  5 days Timing:  Intermittent Progression:  Worsening Chronicity:  New Context: not diet changes, not previous surgeries, not recent illness, not recent travel, not sick contacts and not trauma   Relieved by: rest and being still. Worsened by:  Movement and position changes Ineffective treatments:  Lying down Associated symptoms: anorexia, nausea and vomiting   Associated symptoms: no chest pain, no cough, no diarrhea, no dysuria, no fever, no melena and no shortness of breath   Associated symptoms comment:  Has been nauseated and gagging today Risk factors: no alcohol abuse, no aspirin use, has not had multiple surgeries, no NSAID use and no recent hospitalization     Past Medical History  Diagnosis Date  . Hypothyroidism   . History of cervical dysplasia   . Acute meniscal tear of knee     right knee  . Mild acid reflux     watches diet  . Arthritis     knee   Past Surgical History  Procedure Laterality Date  . Gynecologic cryosurgery  1988    CERVICAL DYSPLASIA  . Tubal ligation  1980  . Knee arthroscopy with medial menisectomy Right 12/28/2012    Procedure: RIGHT KNEE ARTHROSCOPY WITH MEDIAL  MENISECTOMY;  Surgeon: Magnus Sinning, MD;   Location: [place];  Service: Orthopedics;  Laterality: Right;  . Chondroplasty Right 12/28/2012    Procedure:  SHAVING OF LATERAL AND MEDIAL  FEMORAL CHONDRAL;  Surgeon: Magnus Sinning, MD;  Location: [place];  Service: Orthopedics;  Laterality: Right;  . Knee arthroscopy with lateral menisectomy Right 12/28/2012    Procedure: KNEE ARTHROSCOPY WITH LATERAL MENISECTOMY;  Surgeon: Magnus Sinning, MD;  Location: [place];  Service: Orthopedics;  Laterality: Right;   Family History  Problem Relation Age of Onset  . Hypertension Mother   . Cancer Sister     COLON  . Colon cancer Sister 4  . Diabetes Brother   . Diabetes Daughter   . Diabetes Son   . Rectal cancer Neg Hx   . Stomach cancer Neg Hx    History  Substance Use Topics  . Smoking status: Never Smoker   . Smokeless tobacco: Never Used  . Alcohol Use: No   OB History    Gravida Para Term Preterm AB TAB SAB Ectopic Multiple Living   3 3 3       3      Review of Systems  Constitutional: Negative for fever.  Respiratory: Negative for cough and shortness of breath.   Cardiovascular: Negative for chest pain.  Gastrointestinal: Positive for nausea, vomiting, abdominal pain and anorexia. Negative for diarrhea and melena.  Genitourinary: Negative for dysuria.  All other systems reviewed and are negative.  Allergies  Penicillins  Home Medications   Prior to Admission medications   Medication Sig Start Date End Date Taking? Authorizing Provider  aspirin 81 MG tablet Take 81 mg by mouth daily.    Historical Provider, MD  calcium carbonate (OS-CAL) 600 MG TABS tablet Take 600 mg by mouth daily with breakfast.    Historical Provider, MD  diphenhydrAMINE (BENADRYL) 25 MG tablet Take 1 tablet (25 mg total) by mouth every 6 (six) hours. Patient not taking: Reported on 08/01/2014 03/29/13   Dorie Rank, MD  levothyroxine (SYNTHROID, LEVOTHROID) 88 MCG tablet Take 88 mcg  by mouth daily before breakfast.    Historical Provider, MD  meloxicam (MOBIC) 7.5 MG tablet Take 1 tablet (7.5 mg total) by mouth daily. 07/12/14   Ezekiel Slocumb, PA-C  Multiple Vitamin (MULTIVITAMIN) tablet Take 1 tablet by mouth daily.    Historical Provider, MD   BP 169/88 mmHg  Pulse 69  Resp 17  SpO2 100% Physical Exam  Constitutional: She is oriented to person, place, and time. She appears well-developed and well-nourished. No distress.  HENT:  Head: Normocephalic and atraumatic.  Mouth/Throat: Oropharynx is clear and moist.  Eyes: Conjunctivae and EOM are normal. Pupils are equal, round, and reactive to light.  Neck: Normal range of motion. Neck supple.  Cardiovascular: Normal rate, regular rhythm and intact distal pulses.   No murmur heard. Pulmonary/Chest: Effort normal and breath sounds normal. No respiratory distress. She has no wheezes. She has no rales.  Abdominal: Soft. Normal appearance. She exhibits no distension. There is no tenderness. There is no rebound and no guarding.    Right side pain and minimal flank pain  Musculoskeletal: Normal range of motion. She exhibits no edema or tenderness.  Neurological: She is alert and oriented to person, place, and time.  Skin: Skin is warm and dry. No rash noted. No erythema.  Psychiatric: She has a normal mood and affect. Her behavior is normal.  Nursing note and vitals reviewed.   ED Course  Procedures (including critical care time) Labs Review Labs Reviewed  COMPREHENSIVE METABOLIC PANEL - Abnormal; Notable for the following:    Glucose, Bld 118 (*)    GFR calc non Af Amer 67 (*)    GFR calc Af Amer 78 (*)    All other components within normal limits  D-DIMER, QUANTITATIVE  LIPASE, BLOOD  I-STAT TROPOININ, ED    Imaging Review Dg Ribs Unilateral W/chest Right  08/01/2014   CLINICAL DATA:  Right rib pain of unknown origin.  EXAM: RIGHT RIBS AND CHEST - 3+ VIEW  COMPARISON:  No prior exam for comparison. Performed  in conjunction with abdominal radiographs to be reported separately.  FINDINGS: The cortical margins of the right ribs are intact. No fracture or destructive rib lesion. The heart size is normal, there is mild tortuosity of the thoracic aorta. The lungs are clear. There is no consolidation, pleural effusion, or pneumothorax.  IMPRESSION: Unremarkable right rib series.   Electronically Signed   By: Jeb Levering M.D.   On: 08/01/2014 11:58   US Abdomen Complete  08/01/2014   CLINICAL DATA:  Right upper quadrant abdominal pain  EXAM: ULTRASOUND ABDOMEN COMPLETE  COMPARISON:  08/01/2014 radiographs  FINDINGS: Gallbladder: Numerous shadowing gallstones measuring up to 7 mm in diameter. No gallbladder wall thickening or prior cholecystic fluid. Sonographic Murphy's sign absent.  Common bile duct: Diameter: 4 mm  Liver: No focal lesion identified. Increased echogenicity and heterogeneous echotexture favoring diffuse hepatic steatosis, without  a well-defined mass.  IVC: No abnormality visualized.  Pancreas: Visualized portion unremarkable.  Spleen: Size and appearance within normal limits.  Right Kidney: Length: 11.3 cm. Echogenicity within normal limits. No mass or hydronephrosis visualized.  Left Kidney: Length: 10.7 cm. 1.4 cm echogenic nonobstructive renal calculus.  Abdominal aorta: No aneurysm visualized.  Other findings: None.  IMPRESSION: 1. Numerous small gallstones. 2. Large nonobstructive left mid kidney calculus. 3. Diffuse hepatic steatosis.   Electronically Signed   By: Sherryl Barters M.D.   On: 08/01/2014 15:14   Dg Abd Acute W/chest  08/01/2014   ADDENDUM REPORT: 08/01/2014 13:41  ADDENDUM: The impression should read 1. Gallstones. 2. Possible left nephrolithiasis.   Electronically Signed   By: Marcello Moores  Register   On: 08/01/2014 13:41   08/01/2014   CLINICAL DATA:  Gallstones.  EXAM: ACUTE ABDOMEN SERIES (ABDOMEN 2 VIEW & CHEST 1 VIEW)  COMPARISON:  No prior appear  FINDINGS: Multiple  calcifications right upper quadrant consistent with gallstones. Prominent calcification left upper quadrant. Left nephrolithiasis could present this fashion. Calcification within the pelvis could represent phlebolith and or calcified fibroid. Chest x-ray is unremarkable.  IMPRESSION: Negative abdominal radiographs.  No acute cardiopulmonary disease.  Electronically Signed: ByMarcello Moores  Register On: 08/01/2014 12:02     EKG Interpretation   Date/Time:  Wednesday August 01 2014 13:03:00 EST Ventricular Rate:  63 PR Interval:  173 QRS Duration: 84 QT Interval:  407 QTC Calculation: 417 R Axis:   8 Text Interpretation:  Sinus rhythm Atrial premature complex Consider left  atrial enlargement Anteroseptal infarct, old No previous tracing Confirmed  by Maryan Rued  MD, Tammy Ericsson ([id number]) on 08/01/2014 1:17:09 PM      MDM   Final diagnoses:  RUQ pain  Calculus of gallbladder without cholecystitis without obstruction  Musculoskeletal pain    Patient presenting from urgent care for further evaluation of right-sided pain. She's had symptoms for approximately 5 days it's worse with movement but today she's also had nausea and gagging. She denies any risk factors for DVT and denies any respiratory component to the pain. She is otherwise healthy with a history of only hypothyroidism.  She denies change in bowel movements or urinary complaints. CBC and UA from urgent care were within normal limits. Patient had a chest x-ray done at urgent care that showed multiple gallstones present which is why she was sent here for further evaluation. Patient has mild tenderness with palpation to her right side but a negative Murphy's sign and pain is reproduced when she moves. No palpable muscular pain in her back.  CMP, troponin, lipase, right upper quadrant ultrasound pending.  3:43 PM Labs wnl.  [place] with gallstones but no signs of acute complications.  Unsure if this is even the cause of the pt's pain.  May be  muscular.  Low suspicion for lung pathology as dimer is neg and ekg and cardiac markers neg.  Pain is only elicited with movement.  Will treat pain and have pt f/u with general surgery as outpt.    Blanchie Dessert, MD 08/01/14 [place], MD 08/01/14 [id number]  Blanchie Dessert, MD 08/01/14 [id number]

## 2014-08-01 NOTE — ED Notes (Signed)
Patient comes from home with c/o RUQ pain for several days.  Patient endorses nausea, but denies vomiting.  Patient denies chest pain, SOB, and dizziness.  Patient states pain is worse with movement, but certain foods do not exacerbate it. Patient's lung sounds are clear in all lobes.  No S3/S4 or split auscultated.  Bowel sounds normoactive.  +2 dorsalis pedis and radial pulses palpated.  No edema or ascites noted.

## 2014-08-01 NOTE — Progress Notes (Addendum)
Subjective:    Patient ID: Cynthia Bowers, female    DOB: 24-Feb-1946, 68 y.o.   MRN: [id number] This chart was scribed for Cynthia Queen, MD by Marti Sleigh, Medical Scribe. This patient was seen in Room 1 and the patient's care was started at 11:04 AM.  Chief Complaint  Patient presents with  . Flank Pain    HPI HPI Comments: Cynthia Bowers is a 68 y.o. female who presents to [facility] complaining of flank pain that started five days ago, worse with walking or twisting her torso. Pt endorses associated nausea, and vomiting this morning. Pt denies SOB, rash, constipation, diarrhea, dysuria, urgency, or hesitancy. Pt states her bowels have been slightly different recently. Pt states she may have hurt her back lifting a box.   Pt works at a custodian at a school.   Review of Systems  Constitutional: Negative for fever and chills.  Respiratory: Negative for shortness of breath.   Cardiovascular: Negative for chest pain and leg swelling.  Gastrointestinal: Positive for nausea and vomiting. Negative for diarrhea and constipation.  Musculoskeletal: Positive for back pain.       Objective:   Physical Exam  Constitutional: She is oriented to person, place, and time. She appears well-developed and well-nourished.  HENT:  Head: Normocephalic and atraumatic.  Eyes: Pupils are equal, round, and reactive to light.  Neck: Neck supple.  Cardiovascular: Normal rate and regular rhythm.   Pulmonary/Chest: Effort normal and breath sounds normal. No respiratory distress.  Abdominal: Soft. There is no tenderness. There is no CVA tenderness.  Musculoskeletal: Normal range of motion. She exhibits no edema.  Neurological: She is alert and oriented to person, place, and time.  Skin: Skin is warm and dry.  Psychiatric: She has a normal mood and affect. Her behavior is normal.  Nursing note and vitals reviewed.  Results for orders placed or performed in visit on 08/01/14  POCT CBC  Result Value Ref  Range   WBC 5.6 4.6 - 10.2 K/uL   Lymph, poc 1.3 0.6 - 3.4   POC LYMPH PERCENT 22.5 10 - 50 %L   MID (cbc) 0.3 0 - 0.9   POC MID % 5.5 0 - 12 %M   POC Granulocyte 4.0 2 - 6.9   Granulocyte percent 72.0 37 - 80 %G   RBC 4.26 4.04 - 5.48 M/uL   Hemoglobin 12.9 12.2 - 16.2 g/dL   HCT, POC 39.6 37.7 - 47.9 %   MCV 93.1 80 - 97 fL   MCH, POC 30.3 27 - 31.2 pg   MCHC 32.5 31.8 - 35.4 g/dL   RDW, POC 13.5 %   Platelet Count, POC 252 142 - 424 K/uL   MPV 7.8 0 - 99.8 fL  POCT urinalysis dipstick  Result Value Ref Range   Color, UA yellow    Clarity, UA cloudy    Glucose, UA neg    Bilirubin, UA neg    Ketones, UA neg    Spec Grav, UA 1.015    Blood, UA neg    pH, UA 8.5    Protein, UA 30    Urobilinogen, UA 1.0    Nitrite, UA neg    Leukocytes, UA Negative   POCT UA - Microscopic Only  Result Value Ref Range   WBC, Ur, HPF, POC 0-2    RBC, urine, microscopic neg    Bacteria, U Microscopic trace    Mucus, UA neg    Epithelial cells, urine per micros 0-1  Crystals, Ur, HPF, POC neg    Casts, Ur, LPF, POC neg    Yeast, UA neg   [facility] reading (PRIMARY) by  Dr.Kiri Hinderliter multiple gallstones present. 1 Cm. Calc left upper abd      Assessment & Plan:    Films show appears to be a gallbladder filled with stones. There is also a greater than 1 cm circular calcification in the left upper abdomen. I spoke with Dr. Fanny Skates. He is requesting the EDP to do a workup to include d-dimer, troponin, EKG,Cmet,amylase and lipase and then he will be available for consultation.I personally performed the services described in this documentation, which was scribed in my presence. The recorded information has been reviewed and is accurate. Of note there is a small calcification in  the pelvis consistent with a fibroid or phlebolith.

## 2014-08-19 NOTE — Progress Notes (Signed)
Hand pain, right - Plan: DG Hand Complete Right  Right knee pain - Plan: DG Knee Complete 4 Views Right, meloxicam (MOBIC) 7.5 MG tablet  Osteoarthritis of right knee, unspecified osteoarthritis type  Meds ordered this encounter  Medications  . meloxicam (MOBIC) 7.5 MG tablet    Sig: Take 1 tablet (7.5 mg total) by mouth daily.    Dispense:  14 tablet    Refill:  0    Order Specific Question:  Supervising Provider    Answer:  Shawnee Knapp [[id number]]     Reviewed documentation and xray and agree w/ assessment and plan. Delman Cheadle, MD MPH  Dg Ribs Unilateral W/chest Right  08/01/2014   CLINICAL DATA:  Right rib pain of unknown origin.  EXAM: RIGHT RIBS AND CHEST - 3+ VIEW  COMPARISON:  No prior exam for comparison. Performed in conjunction with abdominal radiographs to be reported separately.  FINDINGS: The cortical margins of the right ribs are intact. No fracture or destructive rib lesion. The heart size is normal, there is mild tortuosity of the thoracic aorta. The lungs are clear. There is no consolidation, pleural effusion, or pneumothorax.  IMPRESSION: Unremarkable right rib series.   Electronically Signed   By: Jeb Levering M.D.   On: 08/01/2014 11:58   US Abdomen Complete  08/01/2014   CLINICAL DATA:  Right upper quadrant abdominal pain  EXAM: ULTRASOUND ABDOMEN COMPLETE  COMPARISON:  08/01/2014 radiographs  FINDINGS: Gallbladder: Numerous shadowing gallstones measuring up to 7 mm in diameter. No gallbladder wall thickening or prior cholecystic fluid. Sonographic Murphy's sign absent.  Common bile duct: Diameter: 4 mm  Liver: No focal lesion identified. Increased echogenicity and heterogeneous echotexture favoring diffuse hepatic steatosis, without a well-defined mass.  IVC: No abnormality visualized.  Pancreas: Visualized portion unremarkable.  Spleen: Size and appearance within normal limits.  Right Kidney: Length: 11.3 cm. Echogenicity within normal limits. No mass or hydronephrosis  visualized.  Left Kidney: Length: 10.7 cm. 1.4 cm echogenic nonobstructive renal calculus.  Abdominal aorta: No aneurysm visualized.  Other findings: None.  IMPRESSION: 1. Numerous small gallstones. 2. Large nonobstructive left mid kidney calculus. 3. Diffuse hepatic steatosis.   Electronically Signed   By: Sherryl Barters M.D.   On: 08/01/2014 15:14   Dg Abd Acute W/chest  08/01/2014   ADDENDUM REPORT: 08/01/2014 13:41  ADDENDUM: The impression should read 1. Gallstones. 2. Possible left nephrolithiasis.   Electronically Signed   By: Marcello Moores  Register   On: 08/01/2014 13:41   08/01/2014   CLINICAL DATA:  Gallstones.  EXAM: ACUTE ABDOMEN SERIES (ABDOMEN 2 VIEW & CHEST 1 VIEW)  COMPARISON:  No prior appear  FINDINGS: Multiple calcifications right upper quadrant consistent with gallstones. Prominent calcification left upper quadrant. Left nephrolithiasis could present this fashion. Calcification within the pelvis could represent phlebolith and or calcified fibroid. Chest x-ray is unremarkable.  IMPRESSION: Negative abdominal radiographs.  No acute cardiopulmonary disease.  Electronically Signed: ByMarcello Moores  Register On: 08/01/2014 12:02   .

## 2014-08-23 ENCOUNTER — Other Ambulatory Visit (INDEPENDENT_AMBULATORY_CARE_PROVIDER_SITE_OTHER): Payer: Self-pay | Admitting: General Surgery

## 2014-10-23 ENCOUNTER — Other Ambulatory Visit: Payer: Self-pay | Admitting: General Surgery

## 2014-12-05 IMAGING — CR DG KNEE COMPLETE 4+V*R*
5 series · 5 of 5 positions shown · non-contrast
Comparison: None.

CLINICAL DATA: Right knee pain for 1 week. No injury. Initial
evaluation.

EXAM:
RIGHT KNEE - COMPLETE 4+ VIEW

[lateral]
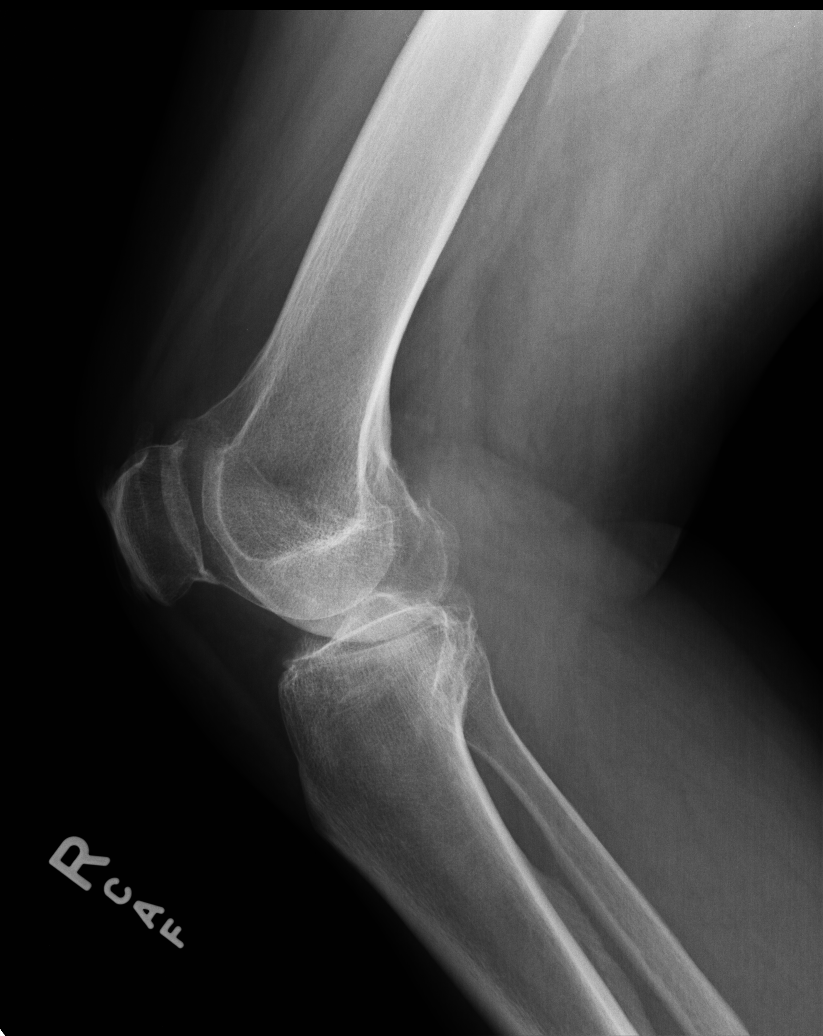

[ap ext rot]
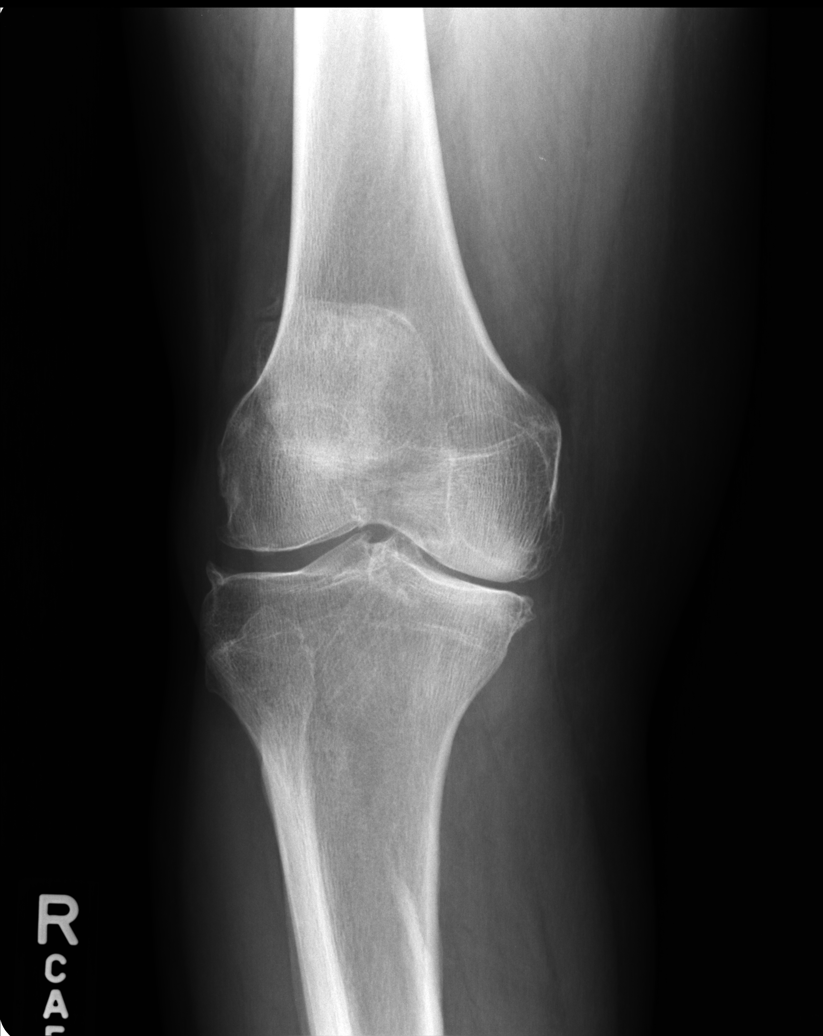

[ap int rot]
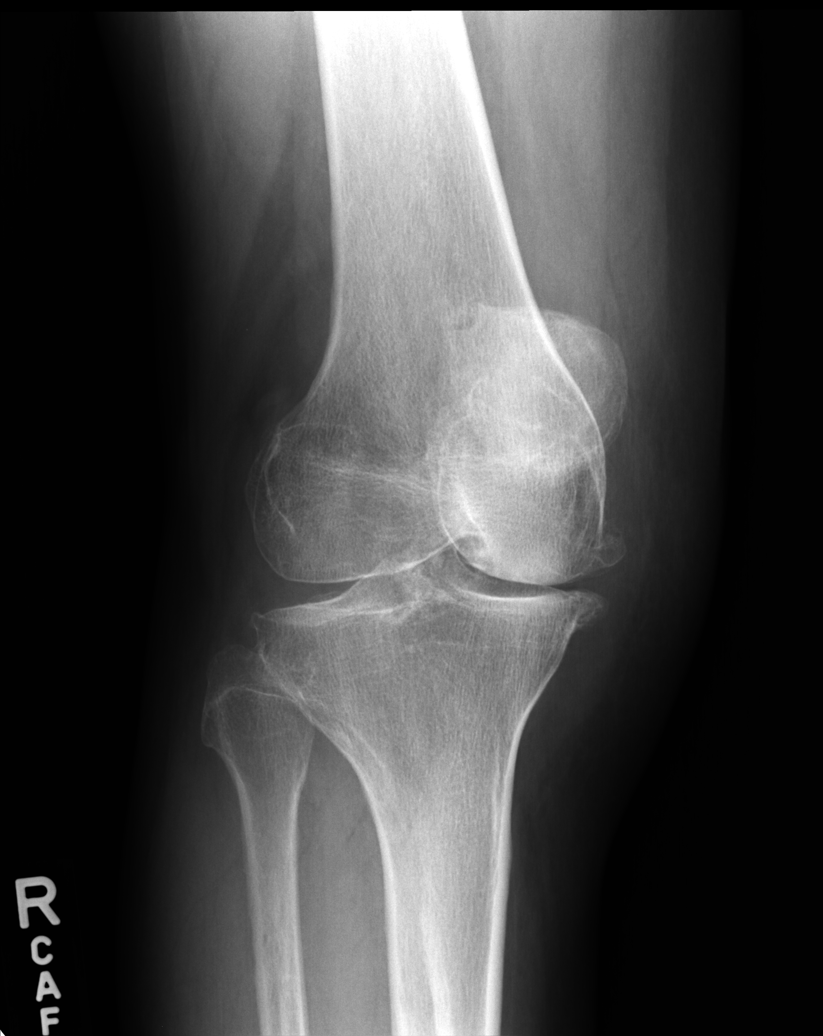

[sunrise]
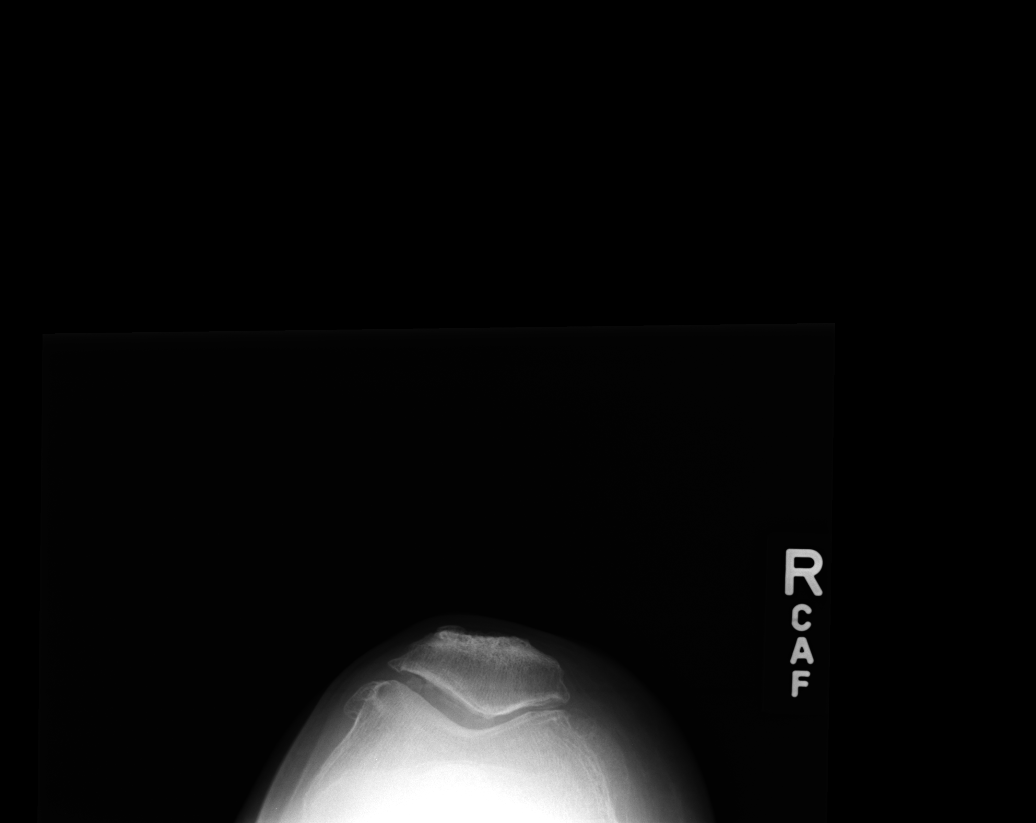

[AP]
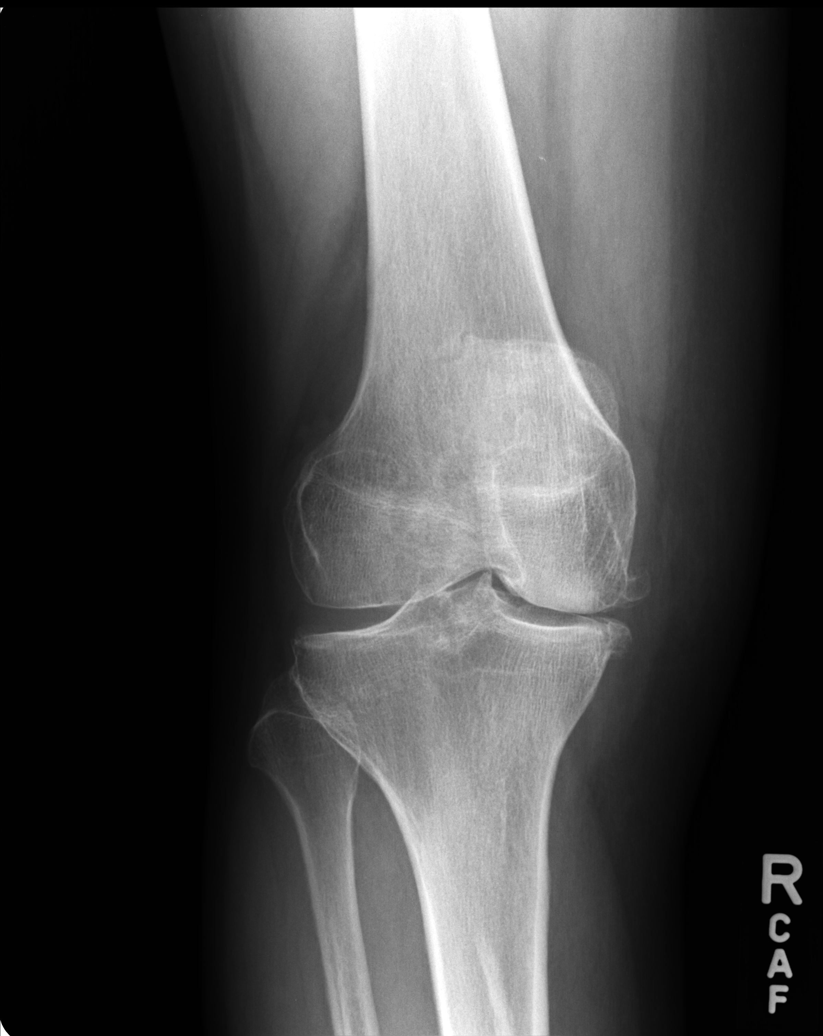

[5 of 5 positions shown; findings below may reference images not displayed]

FINDINGS: No acute bony or joint abnormality. No evidence of fracture or
dislocation. Prominent tricompartment degenerative change.
IMPRESSION: No acute abnormality.  Prominent tract degenerative change.

## 2015-05-09 ENCOUNTER — Encounter: Payer: Self-pay | Admitting: Gastroenterology

## 2015-05-31 ENCOUNTER — Ambulatory Visit (INDEPENDENT_AMBULATORY_CARE_PROVIDER_SITE_OTHER): Payer: Medicare Other | Admitting: Gynecology

## 2015-05-31 ENCOUNTER — Encounter: Payer: Self-pay | Admitting: Gynecology

## 2015-05-31 VITALS — BP 130/84 | Ht 65.0 in | Wt 187.0 lb

## 2015-05-31 DIAGNOSIS — Z124 Encounter for screening for malignant neoplasm of cervix: Secondary | ICD-10-CM

## 2015-05-31 DIAGNOSIS — N952 Postmenopausal atrophic vaginitis: Secondary | ICD-10-CM | POA: Diagnosis not present

## 2015-05-31 DIAGNOSIS — Z01419 Encounter for gynecological examination (general) (routine) without abnormal findings: Secondary | ICD-10-CM | POA: Diagnosis not present

## 2015-05-31 NOTE — Progress Notes (Signed)
Cynthia Bowers 1945-11-09 [id number]        69 y.o.  G3P3003 for breast and pelvic exam.  Past medical history,surgical history, problem list, medications, allergies, family history and social history were all reviewed and documented as reviewed in the EPIC chart.  ROS:  Performed with pertinent positives and negatives included in the history, assessment and plan.   Additional significant findings :  none   Exam: Cynthia Bowers Vitals:   05/31/15 1036  BP: 130/84  Height: 5\' 5"  (1.651 m)  Weight: 187 lb (84.823 kg)   General appearance:  Normal affect, orientation and appearance. Skin: Grossly normal HEENT: Without gross lesions.  No cervical or supraclavicular adenopathy. Thyroid normal.  Lungs:  Clear without wheezing, rales or rhonchi Cardiac: RR, without RMG Abdominal:  Soft, nontender, without masses, guarding, rebound, organomegaly or hernia Breasts:  Examined lying and sitting without masses, retractions, discharge or axillary adenopathy. Pelvic:  Ext/BUS/vagina with atrophic changes  Cervix with atrophic changes. Pap smear done  Uterus anteverted, normal size, shape and contour, midline and mobile nontender   Adnexa  Without masses or tenderness    Anus and perineum  Normal   Rectovaginal  Normal sphincter tone without palpated masses or tenderness.    Assessment/Plan:  69 y.o. G36P3003 female for breast and pelvic exam.   1. Postmenopausal/atrophic genital changes. Without significant hot flashes, night sweats or any vaginal bleeding. No significant issues with vaginal dryness. Is not sexually active. Continue to monitor and report any issues. 2. Pap smear 2013. Pap smear done today. History of cryosurgery 1988. No abnormal Pap smears afterwards. Options to stop screening per current screening guidelines discussed. Will readdress on an annual basis. 3. DEXA 2007 normal. Recommend repeat now at age 24. Patient agrees to schedule. 4. Mammography coming due next  month. Patient knows to schedule. SBE monthly reviewed. 5. Colonoscopy 2015. Repeat at their recommended interval. 6. Health maintenance. No routine lab work done as patient reports is done at her primary physician's office. Follow up 1 year, sooner as needed.   Anastasio Auerbach MD, 11:20 AM 05/31/2015

## 2015-05-31 NOTE — Addendum Note (Signed)
Addended by: Nelva Nay on: 05/31/2015 11:38 AM   Modules accepted: Orders

## 2015-05-31 NOTE — Patient Instructions (Signed)

## 2015-06-03 LAB — CYTOLOGY - PAP

## 2015-06-06 ENCOUNTER — Other Ambulatory Visit (HOSPITAL_COMMUNITY)
Admission: RE | Admit: 2015-06-06 | Discharge: 2015-06-06 | Disposition: A | Discharge status: Home / Self Care | Payer: Medicare Other | Source: Ambulatory Visit | Attending: Gynecology | Admitting: Gynecology

## 2015-06-06 DIAGNOSIS — Z124 Encounter for screening for malignant neoplasm of cervix: Secondary | ICD-10-CM | POA: Insufficient documentation

## 2015-06-11 ENCOUNTER — Ambulatory Visit (INDEPENDENT_AMBULATORY_CARE_PROVIDER_SITE_OTHER): Payer: Medicare Other | Admitting: Gynecology

## 2015-06-11 ENCOUNTER — Ambulatory Visit (INDEPENDENT_AMBULATORY_CARE_PROVIDER_SITE_OTHER): Payer: Medicare Other

## 2015-06-11 ENCOUNTER — Other Ambulatory Visit: Payer: Self-pay | Admitting: Gynecology

## 2015-06-11 DIAGNOSIS — Z780 Asymptomatic menopausal state: Secondary | ICD-10-CM | POA: Diagnosis not present

## 2015-06-11 DIAGNOSIS — Z23 Encounter for immunization: Secondary | ICD-10-CM

## 2015-06-11 DIAGNOSIS — Z01419 Encounter for gynecological examination (general) (routine) without abnormal findings: Secondary | ICD-10-CM

## 2015-06-20 ENCOUNTER — Encounter: Payer: Self-pay | Admitting: Gynecology

## 2016-06-01 ENCOUNTER — Ambulatory Visit (INDEPENDENT_AMBULATORY_CARE_PROVIDER_SITE_OTHER): Payer: Medicare Other | Admitting: Gynecology

## 2016-06-01 ENCOUNTER — Encounter: Payer: Self-pay | Admitting: Gynecology

## 2016-06-01 VITALS — BP 122/78 | Ht 65.0 in | Wt 186.0 lb

## 2016-06-01 DIAGNOSIS — N952 Postmenopausal atrophic vaginitis: Secondary | ICD-10-CM

## 2016-06-01 DIAGNOSIS — Z01419 Encounter for gynecological examination (general) (routine) without abnormal findings: Secondary | ICD-10-CM

## 2016-06-01 DIAGNOSIS — Z23 Encounter for immunization: Secondary | ICD-10-CM | POA: Diagnosis not present

## 2016-06-01 NOTE — Patient Instructions (Signed)

## 2016-06-01 NOTE — Progress Notes (Signed)
    Zanita Redbird 21-Mar-1946 [id number]        70 y.o.  G3P3003  for annual exam.  Doing well without complaints  Past medical history,surgical history, problem list, medications, allergies, family history and social history were all reviewed and documented as reviewed in the EPIC chart.  ROS:  Performed with pertinent positives and negatives included in the history, assessment and plan.   Additional significant findings :  None   Exam: Caryn Bee assistant Vitals:   06/01/16 0953  BP: 122/78  Weight: 186 lb (84.4 kg)  Height: 5\' 5"  (1.651 m)   Body mass index is 30.95 kg/m.  General appearance:  Normal affect, orientation and appearance. Skin: Grossly normal HEENT: Without gross lesions.  No cervical or supraclavicular adenopathy. Thyroid normal.  Lungs:  Clear without wheezing, rales or rhonchi Cardiac: RR, without RMG Abdominal:  Soft, nontender, without masses, guarding, rebound, organomegaly or hernia Breasts:  Examined lying and sitting without masses, retractions, discharge or axillary adenopathy. Pelvic:  Ext, BUS, Vagina with atrophic changes  Cervix with atrophic changes  Uterus anteverted, normal size, shape and contour, midline and mobile nontender   Adnexa without masses or tenderness    Anus and perineum normal   Rectovaginal normal sphincter tone without palpated masses or tenderness.    Assessment/Plan:  70 y.o. G82P3003 female for annual exam.   1. Postmenopausal/atrophic genital changes. No significant hot flushes, night sweats, vaginal dryness or any vaginal bleeding. Continue to monitor report any issues or vaginal bleeding. 2. Pap smear 2016. No Pap smear done today. History of cryosurgery 1988 with normal Pap smears afterwards. Options to stop screening per current screening guidelines or less frequent screening intervals reviewed. Will readdress on annual basis. 3. Mammogram coming due in November and I reminded her to schedule this. SBE monthly  reviewed. 4. DEXA 2016 normal. Repeat at 5 year interval. 5. Colonoscopy 2015. Repeat at their recommended interval. 6. Health maintenance. No routine lab work done as this is done at her primary physician's office. Follow up 1 year, sooner as needed.   Anastasio Auerbach MD, 10:12 AM 06/01/2016

## 2016-06-01 NOTE — Addendum Note (Signed)
Addended by: Nelva Nay on: 06/01/2016 11:01 AM   Modules accepted: Orders

## 2016-06-18 ENCOUNTER — Encounter: Payer: Self-pay | Admitting: Gynecology

## 2017-01-05 ENCOUNTER — Encounter: Payer: Self-pay | Admitting: Family Medicine

## 2017-01-05 ENCOUNTER — Ambulatory Visit (INDEPENDENT_AMBULATORY_CARE_PROVIDER_SITE_OTHER): Payer: Medicare Other | Admitting: Family Medicine

## 2017-01-05 ENCOUNTER — Ambulatory Visit (INDEPENDENT_AMBULATORY_CARE_PROVIDER_SITE_OTHER): Payer: Medicare Other

## 2017-01-05 VITALS — BP 132/80 | HR 71 | Temp 98.3°F | Resp 16 | Ht 64.17 in | Wt 188.8 lb

## 2017-01-05 DIAGNOSIS — M25562 Pain in left knee: Secondary | ICD-10-CM | POA: Diagnosis not present

## 2017-01-05 DIAGNOSIS — Z5181 Encounter for therapeutic drug level monitoring: Secondary | ICD-10-CM

## 2017-01-05 MED ORDER — MELOXICAM 15 MG PO TABS: ORAL_TABLET | ORAL | 1 refills | Status: DC

## 2017-01-05 NOTE — Progress Notes (Signed)
Chief Complaint  Patient presents with  . Knee Pain    L knee, per patient, swollen and painful to walk for the past week    HPI   Patient reports that she has been having left knee pain She states that her knee pain is the lateral joint line She states that it started a week ago with mild swelling The pain was go through the knee with walking [facility] [employer] [facility] and felt the pain again Her pain is currently 7/10 She has not taken anything for the pain because it is so sporadic The pain may last for a few seconds at a time and seems to be related to the way she moves when she is walking Lab Results  Component Value Date   CREATININE 0.86 08/01/2014     Past Medical History:  Diagnosis Date  . Acute meniscal tear of knee    right knee  . Arthritis    knee  . History of cervical dysplasia   . Hypothyroidism   . Mild acid reflux    watches diet    Current Outpatient Prescriptions  Medication Sig Dispense Refill  . aspirin 81 MG chewable tablet Chew 81 mg by mouth daily.    Marland Kitchen levothyroxine (SYNTHROID, LEVOTHROID) 88 MCG tablet Take 88 mcg by mouth daily with breakfast.     . Multiple Vitamin (MULTIVITAMIN WITH MINERALS) TABS tablet Take 1 tablet by mouth daily.    Marland Kitchen acetaminophen (TYLENOL) 500 MG tablet Take 1,000 mg by mouth every 6 (six) hours as needed for mild pain or headache.    . meloxicam (MOBIC) 15 MG tablet Take one tablet by  Mouth daily as needed with food 30 tablet 1   Current Facility-Administered Medications  Medication Dose Route Frequency Provider Last Rate Last Dose  . methylPREDNISolone acetate (DEPO-MEDROL) injection 40 mg  40 mg Intra-Lesional Once Copland, Gay Filler, MD        Allergies:  Allergies  Allergen Reactions  . Penicillins Rash    Past Surgical History:  Procedure Laterality Date  . CHOLECYSTECTOMY    . CHONDROPLASTY Right 12/28/2012   Procedure:  SHAVING OF LATERAL AND MEDIAL  FEMORAL CHONDRAL;  Surgeon: Magnus Sinning, MD;  Location: [place];  Service: Orthopedics;  Laterality: Right;  . GYNECOLOGIC CRYOSURGERY  1988   CERVICAL DYSPLASIA  . KNEE ARTHROSCOPY WITH LATERAL MENISECTOMY Right 12/28/2012   Procedure: KNEE ARTHROSCOPY WITH LATERAL MENISECTOMY;  Surgeon: Magnus Sinning, MD;  Location: [place];  Service: Orthopedics;  Laterality: Right;  . KNEE ARTHROSCOPY WITH MEDIAL MENISECTOMY Right 12/28/2012   Procedure: RIGHT KNEE ARTHROSCOPY WITH MEDIAL  MENISECTOMY;  Surgeon: Magnus Sinning, MD;  Location: [place];  Service: Orthopedics;  Laterality: Right;  . TUBAL LIGATION  1980    Social History   Social History  . Marital status: Married    Spouse name: N/A  . Number of children: N/A  . Years of education: N/A   Social History Main Topics  . Smoking status: Never Smoker  . Smokeless tobacco: Never Used  . Alcohol use No  . Drug use: No  . Sexual activity: Yes    Birth control/ protection: Post-menopausal     Comment: 1st intercourse 41 yo-1 partner   Other Topics Concern  . None   Social History Narrative  . None    ROS  Objective: Vitals:   01/05/17 1121 01/05/17 1214  BP: Marland Kitchen)  141/78 132/80  Pulse: 71   Resp: 16   Temp: 98.3 F (36.8 C)   TempSrc: Oral   SpO2: 98%   Weight: 188 lb 12.8 oz (85.6 kg)   Height: 5' 4.17" (1.63 m)     Physical Exam  Constitutional: She is oriented to person, place, and time. She appears well-developed and well-nourished.  HENT:  Head: Normocephalic and atraumatic.  Pulmonary/Chest: Effort normal.  Neurological: She is alert and oriented to person, place, and time.     Left knee with tenderness over the patella No effusion noted No joint line tenderness Crepitus with ROM Anterior drawer negative Apprehension test negative No laxity  Right knee with crepitus but nontender  Assessment and Plan Jomarie was seen today for knee pain.  Diagnoses and all orders  for this visit:  Acute pain of left knee- pt established at [place] to call mobic given Will check creatinine -     DG Knee Complete 4 Views Left  Encounter for medication monitoring -     Basic metabolic panel  Other orders -     meloxicam (MOBIC) 15 MG tablet; Take one tablet by  Mouth daily as needed with food     [place]

## 2017-01-05 NOTE — Patient Instructions (Addendum)
meloxicam instructions Take 1/2 tablet as needed for joint pain. If pain is not improved increase to one tablet.  Also try aspercreme with lidocaine applied to the knee   IF you received an x-ray today, you will receive an invoice from [facility]. Please contact [facility] at [phone] with questions or concerns regarding your invoice.   IF you received labwork today, you will receive an invoice from [place]. Please contact [facility] at [phone] with questions or concerns regarding your invoice.   Our billing staff will not be able to assist you with questions regarding bills from these companies.  You will be contacted with the lab results as soon as they are available. The fastest way to get your results is to activate your My Chart account. Instructions are located on the last page of this paperwork. If you have not heard from [place] regarding the results in 2 weeks, please contact this office.     Osteoarthritis Osteoarthritis is a type of arthritis that affects tissue that covers the ends of bones in joints (cartilage). Cartilage acts as a cushion between the bones and helps them move smoothly. Osteoarthritis results when cartilage in the joints gets worn down. Osteoarthritis is sometimes called "wear and tear" arthritis. Osteoarthritis is the most common form of arthritis. It often occurs in older people. It is a condition that gets worse over time (a progressive condition). Joints that are most often affected by this condition are in:  Fingers.  Toes.  Hips.  Knees.  Spine, including neck and lower back. What are the causes? This condition is caused by age-related wearing down of cartilage that covers the ends of bones. What increases the risk? The following factors may make you more likely to develop this condition:  Older age.  Being overweight or obese.  Overuse of joints, such as in athletes.  Past injury of a joint.  Past surgery on a  joint.  Family history of osteoarthritis. What are the signs or symptoms? The main symptoms of this condition are pain, swelling, and stiffness in the joint. The joint may lose its shape over time. Small pieces of bone or cartilage may break off and float inside of the joint, which may cause more pain and damage to the joint. Small deposits of bone (osteophytes) may grow on the edges of the joint. Other symptoms may include:  A grating or scraping feeling inside the joint when you move it.  Popping or creaking sounds when you move. Symptoms may affect one or more joints. Osteoarthritis in a major joint, such as your knee or hip, can make it painful to walk or exercise. If you have osteoarthritis in your hands, you might not be able to grip items, twist your hand, or control small movements of your hands and fingers (fine motor skills). How is this diagnosed? This condition may be diagnosed based on:  Your medical history.  A physical exam.  Your symptoms.  X-rays of the affected joint(s).  Blood tests to rule out other types of arthritis. How is this treated? There is no cure for this condition, but treatment can help to control pain and improve joint function. Treatment plans may include:  A prescribed exercise program that allows for rest and joint relief. You may work with a physical therapist.  A weight control plan.  Pain relief techniques, such as:  Applying heat and cold to the joint.  Electric pulses delivered to nerve endings under the skin (transcutaneous electrical nerve stimulation, or TENS).  Massage.  Certain nutritional supplements.  NSAIDs or prescription medicines to help relieve pain.  Medicine to help relieve pain and inflammation (corticosteroids). This can be given by mouth (orally) or as an injection.  Assistive devices, such as a brace, wrap, splint, specialized glove, or cane.  Surgery, such as:  An osteotomy. This is done to reposition the bones  and relieve pain or to remove loose pieces of bone and cartilage.  Joint replacement surgery. You may need this surgery if you have very bad (advanced) osteoarthritis. Follow these instructions at home: Activity   Rest your affected joints as directed by your health care provider.  Do not drive or use heavy machinery while taking prescription pain medicine.  Exercise as directed. Your health care provider or physical therapist may recommend specific types of exercise, such as:  Strengthening exercises. These are done to strengthen the muscles that support joints that are affected by arthritis. They can be performed with weights or with exercise bands to add resistance.  Aerobic activities. These are exercises, such as brisk walking or water aerobics, that get your heart pumping.  Range-of-motion activities. These keep your joints easy to move.  Balance and agility exercises. Managing pain, stiffness, and swelling   If directed, apply heat to the affected area as often as told by your health care provider. Use the heat source that your health care provider recommends, such as a moist heat pack or a heating pad.  If you have a removable assistive device, remove it as told by your health care provider.  Place a towel between your skin and the heat source. If your health care provider tells you to keep the assistive device on while you apply heat, place a towel between the assistive device and the heat source.  Leave the heat on for 20-30 minutes.  Remove the heat if your skin turns bright red. This is especially important if you are unable to feel pain, heat, or cold. You may have a greater risk of getting burned.  If directed, put ice on the affected joint:  If you have a removable assistive device, remove it as told by your health care provider.  Put ice in a plastic bag.  Place a towel between your skin and the bag. If your health care provider tells you to keep the assistive  device on during icing, place a towel between the assistive device and the bag.  Leave the ice on for 20 minutes, 2-3 times a day. General instructions   Take over-the-counter and prescription medicines only as told by your health care provider.  Maintain a healthy weight. Follow instructions from your health care provider for weight control. These may include dietary restrictions.  Do not use any products that contain nicotine or tobacco, such as cigarettes and e-cigarettes. These can delay bone healing. If you need help quitting, ask your health care provider.  Use assistive devices as directed by your health care provider.  Keep all follow-up visits as told by your health care provider. This is important. Where to find more information:  [employer] of Arthritis and Musculoskeletal and Skin Diseases: www.niams.SouthExposed.es  [employer] on Aging: http://kim-miller.com/  American College of Rheumatology: www.rheumatology.org Contact a health care provider if:  Your skin turns red.  You develop a rash.  You have pain that gets worse.  You have a fever along with joint or muscle aches. Get help right away if:  You lose a lot of weight.  You suddenly lose your  appetite.  You have night sweats. Summary  Osteoarthritis is a type of arthritis that affects tissue covering the ends of bones in joints (cartilage).  This condition is caused by age-related wearing down of cartilage that covers the ends of bones.  The main symptom of this condition is pain, swelling, and stiffness in the joint.  There is no cure for this condition, but treatment can help to control pain and improve joint function. This information is not intended to replace advice given to you by your health care provider. Make sure you discuss any questions you have with your health care provider. Document Released: 07/27/2005 Document Revised: 03/30/2016 Document Reviewed: 03/30/2016 Elsevier Interactive  Patient Education  2017 [employer].

## 2017-01-06 LAB — BASIC METABOLIC PANEL
BUN/Creatinine Ratio: 15 (ref 12–28)
BUN: 14 mg/dL (ref 8–27)
CALCIUM: 9.7 mg/dL (ref 8.7–10.3)
CO2: 23 mmol/L (ref 18–29)
Chloride: 106 mmol/L (ref 96–106)
Creatinine, Ser: 0.95 mg/dL (ref 0.57–1.00)
GFR, EST AFRICAN AMERICAN: 70 mL/min/{1.73_m2} (ref >59)
GFR, EST NON AFRICAN AMERICAN: 60 mL/min/{1.73_m2} (ref >59)
Glucose: 105 mg/dL — ABNORMAL HIGH (ref 65–99)
Potassium: 4.4 mmol/L (ref 3.5–5.2)
Sodium: 142 mmol/L (ref 134–144)

## 2017-03-02 ENCOUNTER — Encounter: Payer: Self-pay | Admitting: Family Medicine

## 2017-03-07 ENCOUNTER — Other Ambulatory Visit: Payer: Self-pay | Admitting: Family Medicine

## 2017-06-04 ENCOUNTER — Ambulatory Visit (INDEPENDENT_AMBULATORY_CARE_PROVIDER_SITE_OTHER): Payer: Medicare Other | Admitting: Gynecology

## 2017-06-04 ENCOUNTER — Encounter: Payer: Self-pay | Admitting: Gynecology

## 2017-06-04 VITALS — BP 120/80 | Ht 65.0 in | Wt 189.0 lb

## 2017-06-04 DIAGNOSIS — N952 Postmenopausal atrophic vaginitis: Secondary | ICD-10-CM

## 2017-06-04 DIAGNOSIS — Z01411 Encounter for gynecological examination (general) (routine) with abnormal findings: Secondary | ICD-10-CM

## 2017-06-04 DIAGNOSIS — Z23 Encounter for immunization: Secondary | ICD-10-CM

## 2017-06-04 NOTE — Patient Instructions (Signed)
Follow-up in 1 year for annual exam, sooner if any issues. 

## 2017-06-04 NOTE — Progress Notes (Signed)
    Cynthia Bowers [birth year] [id number]        71 y.o.  G3P3003 for annual gynecologic exam.    Past medical history,surgical history, problem list, medications, allergies, family history and social history were all reviewed and documented as reviewed in the EPIC chart.  ROS:  Performed with pertinent positives and negatives included in the history, assessment and plan.   Additional significant findings : None   Exam: [facility] assistant Vitals:   06/04/17 0900  BP: 120/80  Weight: 189 lb (85.7 kg)  Height: 5\' 5"  (1.651 m)   Body mass index is 31.45 kg/m.  General appearance:  Normal affect, orientation and appearance. Skin: Grossly normal HEENT: Without gross lesions.  No cervical or supraclavicular adenopathy. Thyroid normal.  Lungs:  Clear without wheezing, rales or rhonchi Cardiac: RR, without RMG Abdominal:  Soft, nontender, without masses, guarding, rebound, organomegaly or hernia Breasts:  Examined lying and sitting without masses, retractions, discharge or axillary adenopathy. Pelvic:  Ext, BUS, Vagina: With atrophic changes  Cervix: With atrophic changes  Uterus: Anteverted, normal size, shape and contour, midline and mobile nontender   Adnexa: Without masses or tenderness    Anus and perineum: Normal   Rectovaginal: Normal sphincter tone without palpated masses or tenderness.    Assessment/Plan:  71 y.o. G24P3003 female for annual gynecologic exam.   1. Postmenopausal/atrophic genital changes.  No significant hot flushes, night sweats, vaginal dryness or any vaginal bleeding.  Continue to monitor and report any issues or bleeding. 2. Pap smear 05/2015.  No Pap smear done today.  History of cryosurgery 1988 was normal Pap smears since.  Options to stop screening per current screening guidelines based on age reviewed.  Will readdress on an annual basis. 3. Mammography coming due next month and I reminded her to schedule this.  Breast exam normal today.  SBE  monthly reviewed. 4. DEXA 2016 normal.  Plan repeat DEXA at 5-year interval. 5. Colonoscopy 2015.  Repeat at their recommended interval. 6. Health maintenance.  No routine lab work done as patient reports is done elsewhere.  Follow-up in 1 year, sooner as needed.   Anastasio Auerbach MD, 9:24 AM 06/04/2017

## 2018-06-06 ENCOUNTER — Ambulatory Visit (INDEPENDENT_AMBULATORY_CARE_PROVIDER_SITE_OTHER): Payer: Medicare Other | Admitting: Gynecology

## 2018-06-06 ENCOUNTER — Encounter: Payer: Self-pay | Admitting: Gynecology

## 2018-06-06 VITALS — BP 124/84 | Ht 65.0 in | Wt 190.0 lb

## 2018-06-06 DIAGNOSIS — N952 Postmenopausal atrophic vaginitis: Secondary | ICD-10-CM | POA: Diagnosis not present

## 2018-06-06 DIAGNOSIS — Z01419 Encounter for gynecological examination (general) (routine) without abnormal findings: Secondary | ICD-10-CM | POA: Diagnosis not present

## 2018-06-06 NOTE — Progress Notes (Signed)
    Cynthia Bowers 1946/07/01 [id number]        72 y.o.  G3P3003 for annual gynecologic exam.  Without GYN complaints  Past medical history,surgical history, problem list, medications, allergies, family history and social history were all reviewed and documented as reviewed in the EPIC chart.  ROS:  Performed with pertinent positives and negatives included in the history, assessment and plan.   Additional significant findings : None   Exam: [facility] assistant Vitals:   06/06/18 0832  BP: 124/84  Weight: 190 lb (86.2 kg)  Height: 5\' 5"  (1.651 m)   Body mass index is 31.62 kg/m.  General appearance:  Normal affect, orientation and appearance. Skin: Grossly normal HEENT: Without gross lesions.  No cervical or supraclavicular adenopathy. Thyroid normal.  Lungs:  Clear without wheezing, rales or rhonchi Cardiac: RR, without RMG Abdominal:  Soft, nontender, without masses, guarding, rebound, organomegaly or hernia Breasts:  Examined lying and sitting without masses, retractions, discharge or axillary adenopathy. Pelvic:  Ext, BUS, Vagina: With atrophic changes  Cervix: With atrophic changes.  Pap smear done  Uterus: Anteverted, normal size, shape and contour, midline and mobile nontender   Adnexa: Without masses or tenderness    Anus and perineum: Normal   Rectovaginal: Normal sphincter tone without palpated masses or tenderness.    Assessment/Plan:  72 y.o. G73P3003 female for annual gynecologic exam.   1. Postmenopausal/atrophic genital changes.  No significant menopausal symptoms or any bleeding. 2. Pap smear 2016.  Pap smear done today.  History of cryosurgery 1988 with normal Pap smears since.  Current screening guidelines reviewed and the patient is uncomfortable with stop screening recommendations. 3. Mammography coming due and I reminded her to schedule this.  Breast exam normal today. 4. Colonoscopy 2015.  Repeat at their recommended interval. 5. DEXA 2016 normal.   Plan repeat DEXA at 5-year interval. 6. Health maintenance.  No routine lab work done as patient does this elsewhere.  Follow-up 1 year, sooner as needed.   Anastasio Auerbach MD, 8:49 AM 06/06/2018

## 2018-06-06 NOTE — Addendum Note (Signed)
Addended by: Nelva Nay on: 06/06/2018 08:55 AM   Modules accepted: Orders

## 2018-06-06 NOTE — Patient Instructions (Signed)
Follow-up in 1 year for annual exam 

## 2018-06-07 LAB — PAP IG W/ RFLX HPV ASCU

## 2018-06-23 ENCOUNTER — Encounter: Payer: Self-pay | Admitting: Gynecology

## 2018-06-27 ENCOUNTER — Encounter: Payer: Self-pay | Admitting: Gynecology

## 2019-05-18 ENCOUNTER — Encounter: Payer: Self-pay | Admitting: Gynecology

## 2019-05-24 ENCOUNTER — Encounter: Payer: Self-pay | Admitting: Gastroenterology

## 2019-05-30 ENCOUNTER — Encounter: Payer: Self-pay | Admitting: Gastroenterology

## 2019-06-05 ENCOUNTER — Ambulatory Visit (AMBULATORY_SURGERY_CENTER): Payer: Medicare Other | Admitting: *Deleted

## 2019-06-05 ENCOUNTER — Other Ambulatory Visit: Payer: Self-pay

## 2019-06-05 VITALS — Temp 96.8°F | Ht 65.0 in | Wt 192.4 lb

## 2019-06-05 DIAGNOSIS — Z1159 Encounter for screening for other viral diseases: Secondary | ICD-10-CM

## 2019-06-05 DIAGNOSIS — Z86010 Personal history of colonic polyps: Secondary | ICD-10-CM

## 2019-06-05 MED ORDER — SUPREP BOWEL PREP KIT 17.5-3.13-1.6 GM/177ML PO SOLN: 1.0000 | Freq: Once | ORAL | 0 refills | Status: AC

## 2019-06-05 NOTE — Progress Notes (Signed)
Patient denies any allergies to egg or soy products. Patient denies complications with anesthesia/sedation.  Patient denies oxygen use at home and denies diet medications. Emmi instructions for colonoscopy/endoscopy explained and given to patient.   

## 2019-06-06 ENCOUNTER — Other Ambulatory Visit: Payer: Self-pay

## 2019-06-07 ENCOUNTER — Encounter: Payer: Self-pay | Admitting: Gastroenterology

## 2019-06-08 ENCOUNTER — Other Ambulatory Visit: Payer: Self-pay

## 2019-06-08 ENCOUNTER — Encounter: Payer: Self-pay | Admitting: Gynecology

## 2019-06-08 ENCOUNTER — Encounter: Payer: Medicare Other | Admitting: Gynecology

## 2019-06-08 ENCOUNTER — Ambulatory Visit (INDEPENDENT_AMBULATORY_CARE_PROVIDER_SITE_OTHER): Payer: Medicare Other | Admitting: Gynecology

## 2019-06-08 VITALS — BP 124/82 | Ht 64.0 in | Wt 188.0 lb

## 2019-06-08 DIAGNOSIS — N952 Postmenopausal atrophic vaginitis: Secondary | ICD-10-CM | POA: Diagnosis not present

## 2019-06-08 DIAGNOSIS — Z01419 Encounter for gynecological examination (general) (routine) without abnormal findings: Secondary | ICD-10-CM

## 2019-06-08 NOTE — Progress Notes (Signed)
    Cynthia Bowers 1945-09-08 [id number]        73 y.o.  G3P3003 for annual gynecologic exam.  Without gynecologic complaints  Past medical history,surgical history, problem list, medications, allergies, family history and social history were all reviewed and documented as reviewed in the EPIC chart.  ROS:  Performed with pertinent positives and negatives included in the history, assessment and plan.   Additional significant findings : None   Exam: Caryn [facility] assistant Vitals:   06/08/19 0822  BP: 124/82  Weight: 188 lb (85.3 kg)  Height: 5\' 4"  (1.626 m)   Body mass index is 32.27 kg/m.  General appearance:  Normal affect, orientation and appearance. Skin: Grossly normal HEENT: Without gross lesions.  No cervical or supraclavicular adenopathy. Thyroid normal.  Lungs:  Clear without wheezing, rales or rhonchi Cardiac: RR, without RMG Abdominal:  Soft, nontender, without masses, guarding, rebound, organomegaly or hernia Breasts:  Examined lying and sitting without masses, retractions, discharge or axillary adenopathy. Pelvic:  Ext, BUS, Vagina: With atrophic changes  Cervix: With atrophic changes  Uterus: Anteverted, normal size, shape and contour, midline and mobile nontender   Adnexa: Without masses or tenderness    Anus and perineum: Normal   Rectovaginal: Normal sphincter tone without palpated masses or tenderness.    Assessment/Plan:  73 y.o. G34P3003 female for annual gynecologic exam.   1. Postmenopausal/atrophic genital changes.  No significant menopausal symptoms or any bleeding. 2. Pap smear 05/2018.  No Pap smear done today.  History of cryosurgery 1988 with normal Pap smears afterwards.  Options to stop screening per current screening guidelines reviewed.  Will readdress on annual basis. 3. Mammography 06/2018.  Reminded patient she is coming due to schedule.  Breast exam normal today. 4. Colonoscopy 2015.  Repeat at their recommended interval. 5. DEXA 06/2015  normal.  Plan repeat DEXA next year at 5-year interval. 6. Health maintenance.  No routine lab work done as patient does this elsewhere.  Follow-up 1 year, sooner as needed.   Anastasio Auerbach MD, 8:38 AM 06/08/2019

## 2019-06-08 NOTE — Patient Instructions (Signed)
Follow-up in 1 year for annual exam, sooner if any issues. 

## 2019-06-16 ENCOUNTER — Other Ambulatory Visit: Payer: Self-pay | Admitting: Gastroenterology

## 2019-06-16 LAB — SARS CORONAVIRUS 2 (TAT 6-24 HRS): SARS Coronavirus 2: NEGATIVE

## 2019-06-21 ENCOUNTER — Ambulatory Visit (AMBULATORY_SURGERY_CENTER): Payer: Medicare Other | Admitting: Gastroenterology

## 2019-06-21 ENCOUNTER — Other Ambulatory Visit: Payer: Self-pay

## 2019-06-21 ENCOUNTER — Encounter: Payer: Self-pay | Admitting: Gastroenterology

## 2019-06-21 VITALS — BP 115/71 | HR 67 | Temp 98.1°F | Resp 19 | Ht 65.0 in | Wt 192.0 lb

## 2019-06-21 DIAGNOSIS — D123 Benign neoplasm of transverse colon: Secondary | ICD-10-CM | POA: Diagnosis not present

## 2019-06-21 DIAGNOSIS — D122 Benign neoplasm of ascending colon: Secondary | ICD-10-CM

## 2019-06-21 DIAGNOSIS — Z800 Family history of malignant neoplasm of digestive organs: Secondary | ICD-10-CM

## 2019-06-21 DIAGNOSIS — Z86010 Personal history of colonic polyps: Secondary | ICD-10-CM | POA: Diagnosis not present

## 2019-06-21 MED ORDER — SODIUM CHLORIDE 0.9 % IV SOLN: 500.0000 mL | INTRAVENOUS | Status: DC

## 2019-06-21 NOTE — Op Note (Addendum)
[facility] Patient Name: Cynthia Bowers Procedure Date: 06/21/2019 10:20 AM MRN: [id number] Endoscopist: Ladene Artist , MD Age: 73 Referring MD:  Date of Birth: [birth year] Gender: Female Account #: [id number] Procedure:                Colonoscopy Indications:              High risk colon cancer surveillance: Personal                            history of sessile serrated colon polyp (less than                            10 mm in size) with no dysplasia and tubular                            adenomas. Medicines:                Monitored Anesthesia Care Procedure:                Pre-Anesthesia Assessment:                           - Prior to the procedure, a History and Physical                            was performed, and patient medications and                            allergies were reviewed. The patient's tolerance of                            previous anesthesia was also reviewed. The risks                            and benefits of the procedure and the sedation                            options and risks were discussed with the patient.                            All questions were answered, and informed consent                            was obtained. Prior Anticoagulants: The patient has                            taken no previous anticoagulant or antiplatelet                            agents. ASA Grade Assessment: II - A patient with                            mild systemic disease. After reviewing the risks  and benefits, the patient was deemed in                            satisfactory condition to undergo the procedure.                           After obtaining informed consent, the colonoscope                            was passed under direct vision. Throughout the                            procedure, the patient's blood pressure, pulse, and                            oxygen saturations were monitored continuously. The                           Colonoscope was introduced through the anus and                            advanced to the the cecum, identified by                            appendiceal orifice and ileocecal valve. The                            ileocecal valve, appendiceal orifice, and rectum                            were photographed. The quality of the bowel                            preparation was good. The patient tolerated the                            procedure well. The colonoscopy was somewhat                            difficult due to significant looping and a tortuous                            colon. Scope In: 10:28:51 AM Scope Out: 10:48:00 AM Scope Withdrawal Time: 0 hours 15 minutes 16 seconds  Total Procedure Duration: 0 hours 19 minutes 9 seconds  Findings:                 The perianal and digital rectal examinations were                            normal.                           Five sessile polyps were found in the transverse  colon (4) and ascending colon (1). The polyps were                            6 to 9 mm in size. These polyps were removed with a                            cold snare. Resection and retrieval were complete.                           Multiple medium-mouthed diverticula were found in                            the left colon. There was no evidence of                            diverticular bleeding.                           Internal hemorrhoids were found during                            retroflexion. The hemorrhoids were small and Grade                            I (internal hemorrhoids that do not prolapse).                           The exam was otherwise without abnormality on                            direct and retroflexion views. Complications:            No immediate complications. Estimated blood loss:                            None. Estimated Blood Loss:     Estimated blood loss: none. Impression:                - Five 6 to 9 mm polyps in the transverse colon and                            in the ascending colon, removed with a cold snare.                            Resected and retrieved.                           - Mild diverticulosis in the left colon. There was                            no evidence of diverticular bleeding.                           - Internal hemorrhoids.                           -  The examination was otherwise normal on direct                            and retroflexion views. Recommendation:           - Repeat colonoscopy after studies are complete for                            surveillance based on pathology results, likely 3                            years.                           - Patient has a contact number available for                            emergencies. The signs and symptoms of potential                            delayed complications were discussed with the                            patient. Return to normal activities tomorrow.                            Written discharge instructions were provided to the                            patient.                           - High fiber diet.                           - Continue present medications.                           - Await pathology results. Ladene Artist, MD 06/21/2019 10:57:14 AM This report has been signed electronically.

## 2019-06-21 NOTE — Progress Notes (Signed)
Temp jB  v/s CW I have reviewed the patient's medical history in detail and updated the computerized patient record.

## 2019-06-21 NOTE — Patient Instructions (Signed)
Read all of the handouts given to you by your recovery room nurse.  You will probably need a repeat colonoscopy in 3 years.  Thank-you for choosing [place] for your healthcare needs today.  YOU HAD AN ENDOSCOPIC PROCEDURE TODAY AT [facility]:   Refer to the procedure report that was given to you for any specific questions about what was found during the examination.  If the procedure report does not answer your questions, please call your gastroenterologist to clarify.  If you requested that your care partner not be given the details of your procedure findings, then the procedure report has been included in a sealed envelope for you to review at your convenience later.  YOU SHOULD EXPECT: Some feelings of bloating in the abdomen. Passage of more gas than usual.  Walking can help get rid of the air that was put into your GI tract during the procedure and reduce the bloating. If you had a lower endoscopy (such as a colonoscopy or flexible sigmoidoscopy) you may notice spotting of blood in your stool or on the toilet paper. If you underwent a bowel prep for your procedure, you may not have a normal bowel movement for a few days.  Please Note:  You might notice some irritation and congestion in your nose or some drainage.  This is from the oxygen used during your procedure.  There is no need for concern and it should clear up in a day or so.  SYMPTOMS TO REPORT IMMEDIATELY:   Following lower endoscopy (colonoscopy or flexible sigmoidoscopy):  Excessive amounts of blood in the stool  Significant tenderness or worsening of abdominal pains  Swelling of the abdomen that is new, acute  Fever of 100F or higher  For urgent or emergent issues, a gastroenterologist can be reached at any hour by calling [phone].   DIET:  We do recommend a small meal at first, but then you may proceed to your regular diet.  Drink plenty of fluids but you should avoid alcoholic beverages for 24 hours. Try  to increase the fiber in your diet, and drink plenty of water.  ACTIVITY:  You should plan to take it easy for the rest of today and you should NOT DRIVE or use heavy machinery until tomorrow (because of the sedation medicines used during the test).    FOLLOW UP: Our staff will call the number listed on your records 48-72 hours following your procedure to check on you and address any questions or concerns that you may have regarding the information given to you following your procedure. If we do not reach you, we will leave a message.  We will attempt to reach you two times.  During this call, we will ask if you have developed any symptoms of COVID 19. If you develop any symptoms (ie: fever, flu-like symptoms, shortness of breath, cough etc.) before then, please call [phone].  If you test positive for Covid 19 in the 2 weeks post procedure, please call and report this information to [place].    If any biopsies were taken you will be contacted by phone or by letter within the next 1-3 weeks.  Please call us at [phone] if you have not heard about the biopsies in 3 weeks.    SIGNATURES/CONFIDENTIALITY: You and/or your care partner have signed paperwork which will be entered into your electronic medical record.  These signatures attest to the fact that that the information above on your After Visit Summary has been  reviewed and is understood.  Full responsibility of the confidentiality of this discharge information lies with you and/or your care-partner. 

## 2019-06-21 NOTE — Progress Notes (Signed)
Called to room to assist during endoscopic procedure.  Patient ID and intended procedure confirmed with present staff. Received instructions for my participation in the procedure from the performing physician.  

## 2019-06-21 NOTE — Progress Notes (Signed)
To PACU, VSS. Report to [email or web address] 

## 2019-06-23 ENCOUNTER — Telehealth: Payer: Self-pay

## 2019-06-23 ENCOUNTER — Telehealth: Payer: Self-pay | Admitting: *Deleted

## 2019-06-23 NOTE — Telephone Encounter (Signed)
Left message on follow up call. 

## 2019-06-23 NOTE — Telephone Encounter (Signed)
  Follow up Call-  Call back number 06/21/2019  Post procedure Call Back phone  # [phone]  Permission to leave phone message Yes  Some recent data might be hidden     Patient questions:  Do you have a fever, pain , or abdominal swelling? no Pain Score  0 *  Have you tolerated food without any problems? Yes.    Have you been able to return to your normal activities? Yes.    Do you have any questions about your discharge instructions: Diet   No. Medications  No. Follow up visit  No.  Do you have questions or concerns about your Care? No.  Actions: * If pain score is 4 or above: No action needed, pain <4.   1. Have you developed a fever since your procedure? no  2.   Have you had an respiratory symptoms (SOB or cough) since your procedure? no  3.   Have you tested positive for COVID 19 since your procedure no  4.   Have you had any family members/close contacts diagnosed with the COVID 19 since your procedure?  no   If yes to any of these questions please route to Joylene John, RN and Alphonsa Gin, Therapist, sports.

## 2019-06-27 ENCOUNTER — Encounter: Payer: Self-pay | Admitting: Gastroenterology

## 2019-06-29 ENCOUNTER — Encounter: Payer: Self-pay | Admitting: Gynecology

## 2019-07-10 ENCOUNTER — Encounter: Payer: Self-pay | Admitting: Gynecology

## 2020-05-25 ENCOUNTER — Ambulatory Visit: Payer: Medicare Other | Attending: Internal Medicine

## 2020-05-25 DIAGNOSIS — Z23 Encounter for immunization: Secondary | ICD-10-CM

## 2020-05-25 NOTE — Progress Notes (Signed)
   Covid-19 Vaccination Clinic  Name:  Erikah Thumm    MRN: [id number] DOB: Jan 17, 1946  05/25/2020  Ms. Grygiel was observed post Covid-19 immunization for 15 minutes without incident. She was provided with Vaccine Information Sheet and instruction to access the V-Safe system.   Ms. Panozzo was instructed to call 911 with any severe reactions post vaccine: Marland Kitchen Difficulty breathing  . Swelling of face and throat  . A fast heartbeat  . A bad rash all over body  . Dizziness and weakness

## 2020-06-11 ENCOUNTER — Other Ambulatory Visit: Payer: Self-pay

## 2020-06-11 ENCOUNTER — Ambulatory Visit (INDEPENDENT_AMBULATORY_CARE_PROVIDER_SITE_OTHER): Payer: Medicare Other | Admitting: Obstetrics and Gynecology

## 2020-06-11 ENCOUNTER — Encounter: Payer: Self-pay | Admitting: Obstetrics and Gynecology

## 2020-06-11 VITALS — BP 126/84 | Ht 65.0 in | Wt 188.0 lb

## 2020-06-11 DIAGNOSIS — Z13820 Encounter for screening for osteoporosis: Secondary | ICD-10-CM | POA: Diagnosis not present

## 2020-06-11 DIAGNOSIS — Z01419 Encounter for gynecological examination (general) (routine) without abnormal findings: Secondary | ICD-10-CM | POA: Diagnosis not present

## 2020-06-11 NOTE — Progress Notes (Signed)
   Cynthia Bowers 30-Jul-1946 [id number]  SUBJECTIVE:  74 y.o. [phone] female here for a breast and pelvic exam. She has no gynecologic concerns.  Current Outpatient Medications  Medication Sig Dispense Refill  . acetaminophen (TYLENOL) 500 MG tablet Take 1,000 mg by mouth every 6 (six) hours as needed for mild pain or headache.    Marland Kitchen amLODipine (NORVASC) 10 MG tablet Take 10 mg by mouth daily.    Marland Kitchen aspirin 81 MG chewable tablet Chew 81 mg by mouth daily.    Marland Kitchen atorvastatin (LIPITOR) 10 MG tablet Take 10 mg by mouth daily.    Marland Kitchen levothyroxine (SYNTHROID) 100 MCG tablet Take 100 mcg by mouth every morning.    . Multiple Vitamin (MULTIVITAMIN WITH MINERALS) TABS tablet Take 1 tablet by mouth daily.    . Psyllium (METAMUCIL FIBER PO) Take by mouth.     Current Facility-Administered Medications  Medication Dose Route Frequency Provider Last Rate Last Admin  . methylPREDNISolone acetate (DEPO-MEDROL) injection 40 mg  40 mg Intra-Lesional Once Copland, Jessica C, MD       Allergies: Penicillins  No LMP recorded (lmp unknown). Patient is postmenopausal.  Past medical history,surgical history, problem list, medications, allergies, family history and social history were all reviewed and documented as reviewed in the EPIC chart.  GYN ROS: no abnormal bleeding, pelvic pain or discharge, no breast pain or new or enlarging lumps on self exam.  No dysuria, urinary frequency, pain with urination, cloudy/malodorous urine.   OBJECTIVE:  BP 126/84   Ht 5\' 5"  (1.651 m)   Wt 188 lb (85.3 kg)   LMP  (LMP Unknown)   BMI 31.28 kg/m  The patient appears well, alert, oriented, in no distress.  BREAST EXAM: breasts appear normal, no suspicious masses, no skin or nipple changes or axillary nodes  PELVIC EXAM: VULVA: normal appearing vulva with atrophic change, no masses, tenderness or lesions, VAGINA: normal appearing vagina with atrophic change, normal color and discharge, no lesions, CERVIX: normal  appearing atrophic cervix without discharge or lesions, UTERUS: uterus is normal size, shape, consistency and nontender, ADNEXA: normal adnexa in size, nontender and no masses  Chaperone: Caryn Bee present during the examination  ASSESSMENT:  74 y.o. [id number] here for a breast and pelvic exam  PLAN:   1. Postmenopausal.  No significant hot flashes or night sweats.  No vaginal bleeding. 2. Pap smear 05/2018.  History of cryosurgery in 1988, normal Pap smears since then.  Will address if she desires to continue screening at her next 3-year interval in 2022. 3. Mammogram to be scheduled later this month reminded her to do so.  Normal breast exam today.  Will continue with annual mammograms. 4. Colonoscopy 2020.  She will follow up at the interval recommended by her GI specialist.   5. DEXA 06/2015 was normal.  Next DEXA recommended now at the 5 year interval.so she plans to schedule this. 6. Health maintenance.  No labs today as she normally has these completed elsewhere.  Return annually or sooner, prn.  Joseph Pierini MD 06/11/20

## 2020-07-02 ENCOUNTER — Other Ambulatory Visit: Payer: Self-pay

## 2020-07-02 ENCOUNTER — Other Ambulatory Visit: Payer: Self-pay | Admitting: Obstetrics and Gynecology

## 2020-07-02 ENCOUNTER — Ambulatory Visit (INDEPENDENT_AMBULATORY_CARE_PROVIDER_SITE_OTHER): Payer: Medicare Other

## 2020-07-02 DIAGNOSIS — Z780 Asymptomatic menopausal state: Secondary | ICD-10-CM

## 2020-07-02 DIAGNOSIS — Z01419 Encounter for gynecological examination (general) (routine) without abnormal findings: Secondary | ICD-10-CM

## 2020-07-02 DIAGNOSIS — Z13820 Encounter for screening for osteoporosis: Secondary | ICD-10-CM

## 2020-10-31 DIAGNOSIS — I10 Essential (primary) hypertension: Secondary | ICD-10-CM | POA: Diagnosis not present

## 2020-10-31 DIAGNOSIS — E890 Postprocedural hypothyroidism: Secondary | ICD-10-CM | POA: Diagnosis not present

## 2020-11-07 DIAGNOSIS — R7301 Impaired fasting glucose: Secondary | ICD-10-CM | POA: Diagnosis not present

## 2020-11-07 DIAGNOSIS — R609 Edema, unspecified: Secondary | ICD-10-CM | POA: Diagnosis not present

## 2020-11-07 DIAGNOSIS — I10 Essential (primary) hypertension: Secondary | ICD-10-CM | POA: Diagnosis not present

## 2020-11-07 DIAGNOSIS — E890 Postprocedural hypothyroidism: Secondary | ICD-10-CM | POA: Diagnosis not present

## 2020-12-03 DIAGNOSIS — M7989 Other specified soft tissue disorders: Secondary | ICD-10-CM | POA: Diagnosis not present

## 2020-12-03 DIAGNOSIS — I10 Essential (primary) hypertension: Secondary | ICD-10-CM | POA: Diagnosis not present

## 2020-12-06 DIAGNOSIS — M7989 Other specified soft tissue disorders: Secondary | ICD-10-CM | POA: Diagnosis not present

## 2020-12-06 DIAGNOSIS — R2231 Localized swelling, mass and lump, right upper limb: Secondary | ICD-10-CM | POA: Diagnosis not present

## 2021-01-16 ENCOUNTER — Other Ambulatory Visit (HOSPITAL_BASED_OUTPATIENT_CLINIC_OR_DEPARTMENT_OTHER): Payer: Self-pay

## 2021-01-16 ENCOUNTER — Ambulatory Visit: Payer: Medicare Other | Attending: Internal Medicine

## 2021-01-16 ENCOUNTER — Other Ambulatory Visit: Payer: Self-pay

## 2021-01-16 DIAGNOSIS — Z23 Encounter for immunization: Secondary | ICD-10-CM

## 2021-01-16 MED ORDER — PFIZER-BIONT COVID-19 VAC-TRIS 30 MCG/0.3ML IM SUSP
INTRAMUSCULAR | 0 refills | Status: DC
  Filled 2021-01-16: qty 0.3, 1d supply, fill #0

## 2021-01-16 NOTE — Progress Notes (Signed)
   Covid-19 Vaccination Clinic  Name:  Cynthia Bowers    MRN: [id number] DOB: Dec 21, 1945  01/16/2021  Cynthia Bowers was observed post Covid-19 immunization for 15 minutes without incident. She was provided with Vaccine Information Sheet and instruction to access the V-Safe system.   Cynthia Bowers was instructed to call 911 with any severe reactions post vaccine: Difficulty breathing  Swelling of face and throat  A fast heartbeat  A bad rash all over body  Dizziness and weakness   Immunizations Administered     Name Date Dose VIS Date Route   PFIZER Comrnaty(Gray TOP) Covid-19 Vaccine 01/16/2021  9:57 AM 0.3 mL 07/18/2020 Intramuscular   Manufacturer: [place]   Lot: [id number]   [place]: [phone]

## 2021-01-31 ENCOUNTER — Other Ambulatory Visit (HOSPITAL_COMMUNITY): Payer: Self-pay

## 2021-05-21 DIAGNOSIS — N1831 Chronic kidney disease, stage 3a: Secondary | ICD-10-CM | POA: Diagnosis not present

## 2021-05-21 DIAGNOSIS — E039 Hypothyroidism, unspecified: Secondary | ICD-10-CM | POA: Diagnosis not present

## 2021-05-21 DIAGNOSIS — E785 Hyperlipidemia, unspecified: Secondary | ICD-10-CM | POA: Diagnosis not present

## 2021-05-21 DIAGNOSIS — I10 Essential (primary) hypertension: Secondary | ICD-10-CM | POA: Diagnosis not present

## 2021-05-28 DIAGNOSIS — H40013 Open angle with borderline findings, low risk, bilateral: Secondary | ICD-10-CM | POA: Diagnosis not present

## 2021-05-28 DIAGNOSIS — H04123 Dry eye syndrome of bilateral lacrimal glands: Secondary | ICD-10-CM | POA: Diagnosis not present

## 2021-05-28 DIAGNOSIS — Z961 Presence of intraocular lens: Secondary | ICD-10-CM | POA: Diagnosis not present

## 2021-05-28 DIAGNOSIS — H353131 Nonexudative age-related macular degeneration, bilateral, early dry stage: Secondary | ICD-10-CM | POA: Diagnosis not present

## 2021-06-09 DIAGNOSIS — Z23 Encounter for immunization: Secondary | ICD-10-CM | POA: Diagnosis not present

## 2021-06-09 DIAGNOSIS — I10 Essential (primary) hypertension: Secondary | ICD-10-CM | POA: Diagnosis not present

## 2021-06-09 DIAGNOSIS — R7309 Other abnormal glucose: Secondary | ICD-10-CM | POA: Diagnosis not present

## 2021-06-09 DIAGNOSIS — Z0000 Encounter for general adult medical examination without abnormal findings: Secondary | ICD-10-CM | POA: Diagnosis not present

## 2021-06-09 DIAGNOSIS — N1831 Chronic kidney disease, stage 3a: Secondary | ICD-10-CM | POA: Diagnosis not present

## 2021-06-09 DIAGNOSIS — E785 Hyperlipidemia, unspecified: Secondary | ICD-10-CM | POA: Diagnosis not present

## 2021-06-09 DIAGNOSIS — E039 Hypothyroidism, unspecified: Secondary | ICD-10-CM | POA: Diagnosis not present

## 2021-06-12 ENCOUNTER — Ambulatory Visit: Payer: Medicare Other | Admitting: Obstetrics and Gynecology

## 2021-06-12 ENCOUNTER — Ambulatory Visit (INDEPENDENT_AMBULATORY_CARE_PROVIDER_SITE_OTHER): Payer: Medicare Other | Admitting: Nurse Practitioner

## 2021-06-12 ENCOUNTER — Encounter: Payer: Self-pay | Admitting: Nurse Practitioner

## 2021-06-12 ENCOUNTER — Other Ambulatory Visit: Payer: Self-pay

## 2021-06-12 VITALS — BP 114/78 | Ht 64.0 in | Wt 187.0 lb

## 2021-06-12 DIAGNOSIS — Z01419 Encounter for gynecological examination (general) (routine) without abnormal findings: Secondary | ICD-10-CM

## 2021-06-12 DIAGNOSIS — Z780 Asymptomatic menopausal state: Secondary | ICD-10-CM

## 2021-06-12 NOTE — Progress Notes (Signed)
   Cynthia Bowers [birth year] [id number]   History:  75 y.o. G3P3003 presents for breast and pelvic exam. No GYN complaints. Postmenopausal - no HRT, no bleeding. Cryosurgery 1988, subsequent paps normal. HTN, HLD managed by PCP.   Gynecologic History No LMP recorded (lmp unknown). Patient is postmenopausal.   Contraception: post menopausal status  Health Maintenance Last Pap: 06/06/2018. Results were: Normal Last mammogram: 07/23/2020. Results were: Normal Last colonoscopy: 06/21/2019. Results were: Tubular adenomas, 3-year recall Last Dexa: 07/02/2020. Results were: Normal  Past medical history, past surgical history, family history and social history were all reviewed and documented in the EPIC chart.  Married. Retired. 3 children. Sister diagnosed with colon cancer at age 44.   ROS:  A ROS was performed and pertinent positives and negatives are included.  Exam:  Vitals:   06/12/21 0941  BP: 114/78  Weight: 187 lb (84.8 kg)  Height: 5\' 4"  (1.626 m)   Body mass index is 32.1 kg/m.  General appearance:  Normal Thyroid:  Symmetrical, normal in size, without palpable masses or nodularity. Respiratory  Auscultation:  Clear without wheezing or rhonchi Cardiovascular  Auscultation:  Regular rate, without rubs, murmurs or gallops  Edema/varicosities:  Not grossly evident Abdominal  Soft,nontender, without masses, guarding or rebound.  Liver/spleen:  No organomegaly noted  Hernia:  None appreciated  Skin  Inspection:  Grossly normal Breasts: Examined lying and sitting.   Right: Without masses, retractions, nipple discharge or axillary adenopathy.   Left: Without masses, retractions, nipple discharge or axillary adenopathy. Genitourinary   Inguinal/mons:  Normal without inguinal adenopathy  External genitalia:  Normal appearing vulva with no masses, tenderness, or lesions  BUS/Urethra/Skene's glands:  Normal  Vagina:  Atrophic changes  Cervix:  Normal appearing without  discharge or lesions. Atrophic changes  Uterus:  Normal in size, shape and contour.  Midline and mobile, nontender  Adnexa/parametria:     Rt: Normal in size, without masses or tenderness.   Lt: Normal in size, without masses or tenderness.  Anus and perineum: Normal  Digital rectal exam: Normal sphincter tone without palpated masses or tenderness  Patient informed chaperone available to be present for breast and pelvic exam. Patient has requested no chaperone to be present. Patient has been advised what will be completed during breast and pelvic exam.   Assessment/Plan:  75 y.o. G3P3003 for breast and pelvic exam.   Well female exam with routine gynecological exam - Education provided on SBEs, importance of preventative screenings, current guidelines, high calcium diet, regular exercise, and multivitamin daily.  Labs with PCP.   Postmenopausal - no HRT, no bleeding.   Screening for cervical cancer - 1988 cryosurgery, subsequent paps normal. No longer screening per guidelines.   Screening for breast cancer - Normal mammogram history.  Continue annual screenings.  Normal breast exam today.  Screening for colon cancer - 2020 colonoscopy. Recommended to repeat in 3-years per GI.  Screening for osteoporosis - Normal bone density 06/2020. Will repeat in 5 years per recommendation.   Return in 2 years for breast and pelvic exam.   Tamela Gammon DNP, 10:03 AM 06/12/2021

## 2021-06-20 DIAGNOSIS — H40013 Open angle with borderline findings, low risk, bilateral: Secondary | ICD-10-CM | POA: Diagnosis not present

## 2021-06-20 DIAGNOSIS — H353131 Nonexudative age-related macular degeneration, bilateral, early dry stage: Secondary | ICD-10-CM | POA: Diagnosis not present

## 2021-06-20 DIAGNOSIS — Z961 Presence of intraocular lens: Secondary | ICD-10-CM | POA: Diagnosis not present

## 2021-06-25 ENCOUNTER — Other Ambulatory Visit (HOSPITAL_BASED_OUTPATIENT_CLINIC_OR_DEPARTMENT_OTHER): Payer: Self-pay

## 2021-06-26 ENCOUNTER — Ambulatory Visit: Payer: Medicare Other | Attending: Internal Medicine

## 2021-06-26 ENCOUNTER — Other Ambulatory Visit (HOSPITAL_BASED_OUTPATIENT_CLINIC_OR_DEPARTMENT_OTHER): Payer: Self-pay

## 2021-06-26 DIAGNOSIS — Z23 Encounter for immunization: Secondary | ICD-10-CM

## 2021-06-26 MED ORDER — PFIZER COVID-19 VAC BIVALENT 30 MCG/0.3ML IM SUSP
INTRAMUSCULAR | 0 refills | Status: DC
  Filled 2021-06-26: qty 0.3, 1d supply, fill #0

## 2021-06-26 NOTE — Progress Notes (Signed)
   Covid-19 Vaccination Clinic  Name:  Audrianna Driskill    MRN: [id number] DOB: 05-Aug-1946  06/26/2021  Ms. Oka was observed post Covid-19 immunization for 15 minutes without incident. She was provided with Vaccine Information Sheet and instruction to access the V-Safe system.   Ms. Duba was instructed to call 911 with any severe reactions post vaccine: Difficulty breathing  Swelling of face and throat  A fast heartbeat  A bad rash all over body  Dizziness and weakness   Immunizations Administered     Name Date Dose VIS Date Route   Pfizer Covid-19 Vaccine Bivalent Booster 06/26/2021  9:35 AM 0.3 mL 04/09/2021 Intramuscular   Manufacturer: [place]   Lot: [id number]   [place]: [phone]

## 2021-07-21 DIAGNOSIS — H04123 Dry eye syndrome of bilateral lacrimal glands: Secondary | ICD-10-CM | POA: Diagnosis not present

## 2021-07-21 DIAGNOSIS — H353131 Nonexudative age-related macular degeneration, bilateral, early dry stage: Secondary | ICD-10-CM | POA: Diagnosis not present

## 2021-07-21 DIAGNOSIS — H43811 Vitreous degeneration, right eye: Secondary | ICD-10-CM | POA: Diagnosis not present

## 2021-07-29 DIAGNOSIS — Z1231 Encounter for screening mammogram for malignant neoplasm of breast: Secondary | ICD-10-CM | POA: Diagnosis not present

## 2021-08-25 ENCOUNTER — Other Ambulatory Visit (HOSPITAL_COMMUNITY): Payer: Self-pay

## 2021-08-25 MED ORDER — ZOSTER VAC RECOMB ADJUVANTED 50 MCG/0.5ML IM SUSR
INTRAMUSCULAR | 1 refills | Status: DC
  Filled 2021-08-25: qty 0.5, 1d supply, fill #0
  Filled 2021-10-23 – 2021-11-06 (×2): qty 0.5, 1d supply, fill #1

## 2021-10-23 ENCOUNTER — Other Ambulatory Visit (HOSPITAL_COMMUNITY): Payer: Self-pay

## 2021-10-31 ENCOUNTER — Other Ambulatory Visit (HOSPITAL_COMMUNITY): Payer: Self-pay

## 2021-10-31 DIAGNOSIS — E890 Postprocedural hypothyroidism: Secondary | ICD-10-CM | POA: Diagnosis not present

## 2021-10-31 DIAGNOSIS — R7301 Impaired fasting glucose: Secondary | ICD-10-CM | POA: Diagnosis not present

## 2021-10-31 DIAGNOSIS — I10 Essential (primary) hypertension: Secondary | ICD-10-CM | POA: Diagnosis not present

## 2021-11-04 ENCOUNTER — Other Ambulatory Visit (HOSPITAL_COMMUNITY): Payer: Self-pay

## 2021-11-05 ENCOUNTER — Other Ambulatory Visit (HOSPITAL_COMMUNITY): Payer: Self-pay

## 2021-11-06 ENCOUNTER — Other Ambulatory Visit (HOSPITAL_COMMUNITY): Payer: Self-pay

## 2021-11-07 ENCOUNTER — Other Ambulatory Visit (HOSPITAL_COMMUNITY): Payer: Self-pay

## 2021-11-07 DIAGNOSIS — E890 Postprocedural hypothyroidism: Secondary | ICD-10-CM | POA: Diagnosis not present

## 2021-11-07 DIAGNOSIS — R609 Edema, unspecified: Secondary | ICD-10-CM | POA: Diagnosis not present

## 2021-11-07 DIAGNOSIS — R7309 Other abnormal glucose: Secondary | ICD-10-CM | POA: Diagnosis not present

## 2021-11-07 DIAGNOSIS — I10 Essential (primary) hypertension: Secondary | ICD-10-CM | POA: Diagnosis not present

## 2021-12-09 DIAGNOSIS — E039 Hypothyroidism, unspecified: Secondary | ICD-10-CM | POA: Diagnosis not present

## 2021-12-09 DIAGNOSIS — E785 Hyperlipidemia, unspecified: Secondary | ICD-10-CM | POA: Diagnosis not present

## 2021-12-09 DIAGNOSIS — N1831 Chronic kidney disease, stage 3a: Secondary | ICD-10-CM | POA: Diagnosis not present

## 2021-12-09 DIAGNOSIS — I10 Essential (primary) hypertension: Secondary | ICD-10-CM | POA: Diagnosis not present

## 2022-01-30 DIAGNOSIS — E890 Postprocedural hypothyroidism: Secondary | ICD-10-CM | POA: Diagnosis not present

## 2022-01-30 DIAGNOSIS — R7309 Other abnormal glucose: Secondary | ICD-10-CM | POA: Diagnosis not present

## 2022-02-06 DIAGNOSIS — I10 Essential (primary) hypertension: Secondary | ICD-10-CM | POA: Diagnosis not present

## 2022-02-06 DIAGNOSIS — R7309 Other abnormal glucose: Secondary | ICD-10-CM | POA: Diagnosis not present

## 2022-02-06 DIAGNOSIS — R7301 Impaired fasting glucose: Secondary | ICD-10-CM | POA: Diagnosis not present

## 2022-02-06 DIAGNOSIS — E890 Postprocedural hypothyroidism: Secondary | ICD-10-CM | POA: Diagnosis not present

## 2022-02-06 DIAGNOSIS — R609 Edema, unspecified: Secondary | ICD-10-CM | POA: Diagnosis not present

## 2022-05-14 ENCOUNTER — Other Ambulatory Visit (HOSPITAL_BASED_OUTPATIENT_CLINIC_OR_DEPARTMENT_OTHER): Payer: Self-pay

## 2022-05-14 MED ORDER — INFLUENZA VAC A&B SA ADJ QUAD 0.5 ML IM PRSY
PREFILLED_SYRINGE | INTRAMUSCULAR | 0 refills | Status: DC
  Filled 2022-05-14: qty 0.5, 1d supply, fill #0

## 2022-06-01 DIAGNOSIS — H04123 Dry eye syndrome of bilateral lacrimal glands: Secondary | ICD-10-CM | POA: Diagnosis not present

## 2022-06-01 DIAGNOSIS — H40013 Open angle with borderline findings, low risk, bilateral: Secondary | ICD-10-CM | POA: Diagnosis not present

## 2022-06-01 DIAGNOSIS — Z961 Presence of intraocular lens: Secondary | ICD-10-CM | POA: Diagnosis not present

## 2022-06-01 DIAGNOSIS — H353131 Nonexudative age-related macular degeneration, bilateral, early dry stage: Secondary | ICD-10-CM | POA: Diagnosis not present

## 2022-06-23 ENCOUNTER — Encounter: Payer: Self-pay | Admitting: Nurse Practitioner

## 2022-06-23 DIAGNOSIS — N1831 Chronic kidney disease, stage 3a: Secondary | ICD-10-CM | POA: Diagnosis not present

## 2022-06-23 DIAGNOSIS — Z0000 Encounter for general adult medical examination without abnormal findings: Secondary | ICD-10-CM | POA: Diagnosis not present

## 2022-06-23 DIAGNOSIS — E785 Hyperlipidemia, unspecified: Secondary | ICD-10-CM | POA: Diagnosis not present

## 2022-06-23 DIAGNOSIS — I10 Essential (primary) hypertension: Secondary | ICD-10-CM | POA: Diagnosis not present

## 2022-06-23 DIAGNOSIS — E039 Hypothyroidism, unspecified: Secondary | ICD-10-CM | POA: Diagnosis not present

## 2022-07-07 ENCOUNTER — Other Ambulatory Visit (HOSPITAL_BASED_OUTPATIENT_CLINIC_OR_DEPARTMENT_OTHER): Payer: Self-pay

## 2022-07-07 DIAGNOSIS — Z780 Asymptomatic menopausal state: Secondary | ICD-10-CM | POA: Diagnosis not present

## 2022-07-07 MED ORDER — COMIRNATY 30 MCG/0.3ML IM SUSY
PREFILLED_SYRINGE | INTRAMUSCULAR | 0 refills | Status: DC
  Filled 2022-07-07: qty 0.3, 1d supply, fill #0

## 2022-07-17 ENCOUNTER — Other Ambulatory Visit (HOSPITAL_BASED_OUTPATIENT_CLINIC_OR_DEPARTMENT_OTHER): Payer: Self-pay

## 2022-07-17 MED ORDER — AREXVY 120 MCG/0.5ML IM SUSR
INTRAMUSCULAR | 0 refills | Status: DC
  Filled 2022-07-17: qty 0.5, 1d supply, fill #0

## 2022-07-24 ENCOUNTER — Encounter: Payer: Self-pay | Admitting: Nurse Practitioner

## 2022-07-24 ENCOUNTER — Ambulatory Visit (INDEPENDENT_AMBULATORY_CARE_PROVIDER_SITE_OTHER): Payer: Medicare Other | Admitting: Nurse Practitioner

## 2022-07-24 VITALS — BP 130/80 | HR 66 | Ht 64.0 in | Wt 175.0 lb

## 2022-07-24 DIAGNOSIS — Z86010 Personal history of colonic polyps: Secondary | ICD-10-CM

## 2022-07-24 DIAGNOSIS — Z09 Encounter for follow-up examination after completed treatment for conditions other than malignant neoplasm: Secondary | ICD-10-CM | POA: Diagnosis not present

## 2022-07-24 MED ORDER — NA SULFATE-K SULFATE-MG SULF 17.5-3.13-1.6 GM/177ML PO SOLN: 1.0000 | Freq: Once | ORAL | 0 refills | Status: AC

## 2022-07-24 NOTE — Progress Notes (Signed)
Assessment    Patient profile:  Cynthia Bowers is a 76 y.o. year old female  known to Dr. Fuller Plan with a past medical history of adenomatous colon  polyps, HTN, hypothyroidism.  See PMH / [place] for additional history  # 76 yo female with a history of multiple colon polyps (tubular adenomas and sessile serrated ) Nov 2020.  [facility] of colon cancer in sister around age 67. Patient is due for a 3-year surveillance colonoscopy   Plan:    Schedule for a polyp surveillance colonoscopy. The risks and benefits of colonoscopy with possible polypectomy / biopsies were discussed and the patient agrees to proceed.     HPI:    Chief Complaint: PCP referred for history of colon polpys  Cynthia Bowers had several polyps removed in Nov 2020. She is due for recommended three year follow up colonoscopy Her sister was diagnosed with colon cancer at age 75.  Patient has not had any blood in her stool.  She has occasional constipation but overall manages well with daily Metamucil.  She has no GI complaints  Baby aspirin on home med list but she is no longer taking it.    Previous GI Evaluation  Nov 2020 colonoscopy  - Five 6 to 9 mm polyps in the transverse colon and in the ascending colon, removed with a cold snare. Resected and retrieved. - Mild diverticulosis in the left colon. There was no evidence of diverticular bleeding. - Internal hemorrhoids. - The examination was otherwise normal on direct and retroflexion views.  Diagnosis Surgical [P], colon, transverse-4 and ascending, polyp (5) - TUBULAR ADENOMA (3 FRAGMENTS) - SESSILE SERRATED POLYP (3 FRAGMENTS) - NO HIGH GRADE DYSPLASIA OR MALIGNANCY IDENTIFIED   Past Medical History:  Diagnosis Date   Acute meniscal tear of knee    right knee   Arthritis    knee   Cataract    surgery to remove cataracts   History of cervical dysplasia    Hypertension    Hypothyroidism    Mild acid reflux    watches diet   Past Surgical History:  Procedure  Laterality Date   CHOLECYSTECTOMY     CHONDROPLASTY Right 12/28/2012   Procedure:  SHAVING OF LATERAL AND MEDIAL  FEMORAL CHONDRAL;  Surgeon: Magnus Sinning, MD;  Location: [facility];  Service: Orthopedics;  Laterality: Right;   COLONOSCOPY  2015   [facility]   EYE SURGERY Bilateral    cataracts removed   GYNECOLOGIC CRYOSURGERY  1988   CERVICAL DYSPLASIA   KNEE ARTHROSCOPY WITH LATERAL MENISECTOMY Right 12/28/2012   Procedure: KNEE ARTHROSCOPY WITH LATERAL MENISECTOMY;  Surgeon: Magnus Sinning, MD;  Location: [facility];  Service: Orthopedics;  Laterality: Right;   KNEE ARTHROSCOPY WITH MEDIAL MENISECTOMY Right 12/28/2012   Procedure: RIGHT KNEE ARTHROSCOPY WITH MEDIAL  MENISECTOMY;  Surgeon: Magnus Sinning, MD;  Location: [facility];  Service: Orthopedics;  Laterality: Right;   TUBAL LIGATION  1980   WISDOM TOOTH EXTRACTION     Family History  Problem Relation Age of Onset   Hypertension Mother    Cancer Sister        COLON   Colon cancer Sister 74   Diabetes Brother    Diabetes Daughter    Diabetes Son    Rectal cancer Neg Hx    Stomach cancer Neg Hx    Social History   Tobacco Use   Smoking status: Never   Smokeless tobacco: Never  Vaping Use   Vaping Use: Never used  Substance Use Topics   Alcohol use: No    Alcohol/week: 0.0 standard drinks of alcohol   Drug use: No   Current Outpatient Medications  Medication Sig Dispense Refill   acetaminophen (TYLENOL) 500 MG tablet Take 1,000 mg by mouth every 6 (six) hours as needed for mild pain or headache.     amLODipine (NORVASC) 10 MG tablet Take 10 mg by mouth daily.     aspirin 81 MG chewable tablet Chew 81 mg by mouth daily.     atorvastatin (LIPITOR) 10 MG tablet Take 10 mg by mouth daily.     COVID-19 mRNA bivalent vaccine, Pfizer, (PFIZER COVID-19 VAC BIVALENT) injection Inject into the muscle. 0.3 mL 0   COVID-19 mRNA Vac-TriS, Pfizer, (PFIZER-BIONT COVID-19  VAC-TRIS) SUSP injection Inject into the muscle. 0.3 mL 0   COVID-19 mRNA vaccine 2023-2024 (COMIRNATY) syringe Inject into the muscle. 0.3 mL 0   hydrochlorothiazide (MICROZIDE) 12.5 MG capsule Take 12.5 mg by mouth daily.     influenza vaccine adjuvanted (FLUAD) 0.5 ML injection Inject into the muscle. 0.5 mL 0   levothyroxine (SYNTHROID) 100 MCG tablet Take 100 mcg by mouth every morning.     Multiple Vitamin (MULTIVITAMIN WITH MINERALS) TABS tablet Take 1 tablet by mouth daily.     Psyllium (METAMUCIL FIBER PO) Take by mouth.     RSV vaccine recomb adjuvanted (AREXVY) 120 MCG/0.5ML injection Inject into the muscle. 0.5 mL 0   Zoster Vaccine Adjuvanted [facility]) injection Inject into the muscle. 0.5 mL 1   No current facility-administered medications for this visit.   Allergies  Allergen Reactions   Penicillins Rash     Review of Systems: All systems reviewed and negative except where noted in HPI.   Wt Readings from Last 3 Encounters:  06/12/21 187 lb (84.8 kg)  06/11/20 188 lb (85.3 kg)  06/21/19 192 lb (87.1 kg)    Physical Exam  BP 130/80, HR 66 LMP  (LMP Unknown)  Constitutional:  Generally well appearing female in no acute distress. Psychiatric: Pleasant. Normal mood and affect. Behavior is normal. EENT: Pupils normal.  Conjunctivae are normal. No scleral icterus. Neck supple.  Cardiovascular: Normal rate, regular rhythm.  Pulmonary/chest: Effort normal and breath sounds normal. No wheezing, rales or rhonchi. Abdominal: Soft, nondistended, nontender. Bowel sounds active throughout. There are no masses palpable. No hepatomegaly. Neurological: Alert and oriented to person place and time. Extremities: No edema Skin: Skin is warm and dry. No rashes noted.  Tye Savoy, NP  07/24/2022, 8:18 AM  Cc:  Jillyn Ledger, FNP

## 2022-07-24 NOTE — Patient Instructions (Addendum)
_______________________________________________________  If you are age 77 or older, your body mass index should be between 23-30. Your Body mass index is 30.04 kg/m. If this is out of the aforementioned range listed, please consider follow up with your Primary Care Provider.  If you are age 65 or younger, your body mass index should be between 19-25. Your Body mass index is 30.04 kg/m. If this is out of the aformentioned range listed, please consider follow up with your Primary Care Provider.   ________________________________________________________  The [facility] providers would like to encourage you to use [facility] to communicate with providers for non-urgent requests or questions.  Due to long hold times on the telephone, sending your provider a message by [facility] may be a faster and more efficient way to get a response.  Please allow 48 business hours for a response.  Please remember that this is for non-urgent requests.  _______________________________________________________   Dennis Bast have been scheduled for a colonoscopy. Please follow written instructions given to you at your visit today.  Please pick up your prep supplies at the pharmacy within the next 1-3 days. If you use inhalers (even only as needed), please bring them with you on the day of your procedure.   Due to recent changes in healthcare laws, you may see the results of your imaging and laboratory studies on MyChart before your provider has had a chance to review them.  We understand that in some cases there may be results that are confusing or concerning to you. Not all laboratory results come back in the same time frame and the provider may be waiting for multiple results in order to interpret others.  Please give [place] 48 hours in order for your provider to thoroughly review all the results before contacting the office for clarification of your results.   It was a pleasure to see you today!  Thank you for trusting me with  your gastrointestinal care!

## 2022-07-27 ENCOUNTER — Telehealth: Payer: Self-pay

## 2022-07-27 NOTE — Telephone Encounter (Signed)
-----   Message from Cynthia Artist, MD sent at 07/27/2022 12:34 PM EST ----- This patient is on the [facility] schedule for an EGD tomorrow. The 12/15 office note says she is for a colonoscopy. Please contact her to be sure she has a bowel prep and change the [facility] procedure to colonoscopy. Thx.

## 2022-07-27 NOTE — Telephone Encounter (Signed)
Left message for patient to call back. Appointment changed to colon. Per OV note patient needed colon & was given instructions for colon with suprep.

## 2022-07-27 NOTE — Telephone Encounter (Signed)
This patient was seen in the office by Tye Savoy, NP on 07/24/22.  Office notes states that patient was scheduled for a colonoscopy with a diagnosis of hx of colon polyps and received a letter for Suprep instructions, however pt is on Dr. Lynne Leader schedule tomorrow (07/28/22) for an EGD.  Please confirm if pt should be scheduled for a colonoscopy and update procedure type on the pt appt and on Dr. Lynne Leader schedule.  Thanks.

## 2022-07-28 ENCOUNTER — Encounter: Payer: Self-pay | Admitting: Gastroenterology

## 2022-07-28 ENCOUNTER — Ambulatory Visit (AMBULATORY_SURGERY_CENTER): Payer: Medicare Other | Admitting: Gastroenterology

## 2022-07-28 VITALS — BP 98/71 | HR 62 | Temp 96.0°F | Resp 10 | Ht 64.0 in | Wt 175.0 lb

## 2022-07-28 DIAGNOSIS — Z09 Encounter for follow-up examination after completed treatment for conditions other than malignant neoplasm: Secondary | ICD-10-CM

## 2022-07-28 DIAGNOSIS — Z86010 Personal history of colonic polyps: Secondary | ICD-10-CM

## 2022-07-28 DIAGNOSIS — D125 Benign neoplasm of sigmoid colon: Secondary | ICD-10-CM

## 2022-07-28 DIAGNOSIS — Z800 Family history of malignant neoplasm of digestive organs: Secondary | ICD-10-CM | POA: Diagnosis not present

## 2022-07-28 DIAGNOSIS — D122 Benign neoplasm of ascending colon: Secondary | ICD-10-CM | POA: Diagnosis not present

## 2022-07-28 DIAGNOSIS — D123 Benign neoplasm of transverse colon: Secondary | ICD-10-CM

## 2022-07-28 MED ORDER — SODIUM CHLORIDE 0.9 % IV SOLN: 500.0000 mL | Freq: Once | INTRAVENOUS | Status: DC

## 2022-07-28 NOTE — Telephone Encounter (Signed)
Patient did confirm she has colonoscopy scheduled this morning & did receive prep instructions.

## 2022-07-28 NOTE — Op Note (Addendum)
[facility] Patient Name: Cynthia Bowers Procedure Date: 07/28/2022 9:41 AM MRN: [id number] Endoscopist: Ladene Artist , MD, [id number] Age: 76 Referring MD:  Date of Birth: 05-Feb-1946 Gender: Female Account #: [id number] Procedure:                Colonoscopy Indications:              Surveillance: Personal history of adenomatous                            polyps on last colonoscopy 3 years ago. Family                            history of colon cancer, 1st-degree relative. Medicines:                Monitored Anesthesia Care Procedure:                Pre-Anesthesia Assessment:                           - Prior to the procedure, a History and Physical                            was performed, and patient medications and                            allergies were reviewed. The patient's tolerance of                            previous anesthesia was also reviewed. The risks                            and benefits of the procedure and the sedation                            options and risks were discussed with the patient.                            All questions were answered, and informed consent                            was obtained. Prior Anticoagulants: The patient has                            taken no anticoagulant or antiplatelet agents. ASA                            Grade Assessment: II - A patient with mild systemic                            disease. After reviewing the risks and benefits,                            the patient was deemed in satisfactory condition to  undergo the procedure.                           After obtaining informed consent, the colonoscope                            was passed under direct vision. Throughout the                            procedure, the patient's blood pressure, pulse, and                            oxygen saturations were monitored continuously. The                            Olympus  PCF-H190DL (#[id number]) Colonoscope was                            introduced through the anus and advanced to the the                            cecum, identified by appendiceal orifice and                            ileocecal valve. The ileocecal valve, appendiceal                            orifice, and rectum were photographed. The quality                            of the bowel preparation was good. The colonoscopy                            was performed without difficulty. The patient                            tolerated the procedure well. Scope In: 9:51:29 AM Scope Out: 10:08:46 AM Scope Withdrawal Time: 0 hours 10 minutes 33 seconds  Total Procedure Duration: 0 hours 17 minutes 17 seconds  Findings:                 The perianal and digital rectal examinations were                            normal.                           Three sessile polyps were found in the sigmoid                            colon, transverse colon and ascending colon. The                            polyps were 6 to 8 mm in size. These polyps were  removed with a cold snare. Resection and retrieval                            were complete.                           Scattered small-mouthed diverticula were found in                            the entire colon. There was no evidence of                            diverticular bleeding.                           Internal hemorrhoids were found during                            retroflexion. The hemorrhoids were moderate and                            Grade I (internal hemorrhoids that do not prolapse).                           The exam was otherwise without abnormality on                            direct and retroflexion views. Complications:            No immediate complications. Estimated blood loss:                            None. Estimated Blood Loss:     Estimated blood loss: none. Impression:               - Three 6 to 8 mm  polyps in the sigmoid colon, in                            the transverse colon and in the ascending colon,                            removed with a cold snare. Resected and retrieved.                           - Mild diverticulosis in the entire examined colon.                           - Internal hemorrhoids.                           - The examination was otherwise normal on direct                            and retroflexion views. Recommendation:           - Repeat colonoscopy after studies are complete for  surveillance based on pathology results.                           - Patient has a contact number available for                            emergencies. The signs and symptoms of potential                            delayed complications were discussed with the                            patient. Return to normal activities tomorrow.                            Written discharge instructions were provided to the                            patient.                           - High fiber diet.                           - Continue present medications.                           - Await pathology results. Ladene Artist, MD 07/28/2022 10:17:07 AM This report has been signed electronically.

## 2022-07-28 NOTE — Progress Notes (Signed)
VS completed by DT.  Pt's states no medical or surgical changes since previsit or office visit.  

## 2022-07-28 NOTE — Progress Notes (Signed)
Sedate, gd SR, tolerated procedure well, VSS, report to RN 

## 2022-07-28 NOTE — Patient Instructions (Addendum)
Handouts Provided:  Polyps, High Fiber Diet and Diverticulosis  - Repeat colonoscopy after studies are complete for surveillance based on pathology results. - Patient has a contact number available for emergencies. The signs and symptoms of potential delayed complications were discussed with the patient. Return to normal activities tomorrow. Written discharge instructions were provided to the patient. - High fiber diet. - Continue present medications. - Await pathology results.  YOU HAD AN ENDOSCOPIC PROCEDURE TODAY AT [facility]:   Refer to the procedure report that was given to you for any specific questions about what was found during the examination.  If the procedure report does not answer your questions, please call your gastroenterologist to clarify.  If you requested that your care partner not be given the details of your procedure findings, then the procedure report has been included in a sealed envelope for you to review at your convenience later.  YOU SHOULD EXPECT: Some feelings of bloating in the abdomen. Passage of more gas than usual.  Walking can help get rid of the air that was put into your GI tract during the procedure and reduce the bloating. If you had a lower endoscopy (such as a colonoscopy or flexible sigmoidoscopy) you may notice spotting of blood in your stool or on the toilet paper. If you underwent a bowel prep for your procedure, you may not have a normal bowel movement for a few days.  Please Note:  You might notice some irritation and congestion in your nose or some drainage.  This is from the oxygen used during your procedure.  There is no need for concern and it should clear up in a day or so.  SYMPTOMS TO REPORT IMMEDIATELY:  Following lower endoscopy (colonoscopy or flexible sigmoidoscopy):  Excessive amounts of blood in the stool  Significant tenderness or worsening of abdominal pains  Swelling of the abdomen that is new, acute  Fever of  100F or higher  For urgent or emergent issues, a gastroenterologist can be reached at any hour by calling [phone]. Do not use MyChart messaging for urgent concerns.    DIET:  We do recommend a small meal at first, but then you may proceed to your regular diet.  Drink plenty of fluids but you should avoid alcoholic beverages for 24 hours.  ACTIVITY:  You should plan to take it easy for the rest of today and you should NOT DRIVE or use heavy machinery until tomorrow (because of the sedation medicines used during the test).    FOLLOW UP: Our staff will call the number listed on your records the next business day following your procedure.  We will call around 7:15- 8:00 am to check on you and address any questions or concerns that you may have regarding the information given to you following your procedure. If we do not reach you, we will leave a message.     If any biopsies were taken you will be contacted by phone or by letter within the next 1-3 weeks.  Please call us at [phone] if you have not heard about the biopsies in 3 weeks.    SIGNATURES/CONFIDENTIALITY: You and/or your care partner have signed paperwork which will be entered into your electronic medical record.  These signatures attest to the fact that that the information above on your After Visit Summary has been reviewed and is understood.  Full responsibility of the confidentiality of this discharge information lies with you and/or your care-partner.

## 2022-07-28 NOTE — Progress Notes (Signed)
See 07/24/2022 H&P, no changes

## 2022-07-28 NOTE — Progress Notes (Signed)
Called to room to assist during endoscopic procedure.  Patient ID and intended procedure confirmed with present staff. Received instructions for my participation in the procedure from the performing physician.  

## 2022-07-29 ENCOUNTER — Telehealth: Payer: Self-pay

## 2022-07-29 NOTE — Telephone Encounter (Signed)
  Follow up Call-     07/28/2022    9:22 AM  Call back number  Post procedure Call Back phone  # [phone]  Permission to leave phone message Yes     Patient questions:  Do you have a fever, pain , or abdominal swelling? No. Pain Score  0 *  Have you tolerated food without any problems? Yes.    Have you been able to return to your normal activities? Yes.    Do you have any questions about your discharge instructions: Diet   No. Medications  No. Follow up visit  No.  Do you have questions or concerns about your Care? No.  Actions: * If pain score is 4 or above: No action needed, pain <4.

## 2022-08-11 DIAGNOSIS — Z1231 Encounter for screening mammogram for malignant neoplasm of breast: Secondary | ICD-10-CM | POA: Diagnosis not present

## 2022-08-17 ENCOUNTER — Encounter: Payer: Self-pay | Admitting: Gastroenterology

## 2022-12-15 DIAGNOSIS — I10 Essential (primary) hypertension: Secondary | ICD-10-CM | POA: Diagnosis not present

## 2022-12-15 DIAGNOSIS — E039 Hypothyroidism, unspecified: Secondary | ICD-10-CM | POA: Diagnosis not present

## 2022-12-15 DIAGNOSIS — M7732 Calcaneal spur, left foot: Secondary | ICD-10-CM | POA: Diagnosis not present

## 2022-12-15 DIAGNOSIS — M79672 Pain in left foot: Secondary | ICD-10-CM | POA: Diagnosis not present

## 2022-12-15 DIAGNOSIS — E785 Hyperlipidemia, unspecified: Secondary | ICD-10-CM | POA: Diagnosis not present

## 2022-12-15 DIAGNOSIS — N1831 Chronic kidney disease, stage 3a: Secondary | ICD-10-CM | POA: Diagnosis not present

## 2023-02-01 DIAGNOSIS — R7309 Other abnormal glucose: Secondary | ICD-10-CM | POA: Diagnosis not present

## 2023-02-01 DIAGNOSIS — R7301 Impaired fasting glucose: Secondary | ICD-10-CM | POA: Diagnosis not present

## 2023-02-01 DIAGNOSIS — E890 Postprocedural hypothyroidism: Secondary | ICD-10-CM | POA: Diagnosis not present

## 2023-02-08 DIAGNOSIS — R7301 Impaired fasting glucose: Secondary | ICD-10-CM | POA: Diagnosis not present

## 2023-02-08 DIAGNOSIS — I10 Essential (primary) hypertension: Secondary | ICD-10-CM | POA: Diagnosis not present

## 2023-02-08 DIAGNOSIS — R609 Edema, unspecified: Secondary | ICD-10-CM | POA: Diagnosis not present

## 2023-02-08 DIAGNOSIS — E890 Postprocedural hypothyroidism: Secondary | ICD-10-CM | POA: Diagnosis not present

## 2023-03-03 DIAGNOSIS — M25562 Pain in left knee: Secondary | ICD-10-CM | POA: Diagnosis not present

## 2023-04-06 DIAGNOSIS — M25562 Pain in left knee: Secondary | ICD-10-CM | POA: Diagnosis not present

## 2023-05-03 ENCOUNTER — Other Ambulatory Visit (HOSPITAL_BASED_OUTPATIENT_CLINIC_OR_DEPARTMENT_OTHER): Payer: Self-pay

## 2023-05-03 MED ORDER — COVID-19 MRNA VAC-TRIS(PFIZER) 30 MCG/0.3ML IM SUSY
0.3000 mL | PREFILLED_SYRINGE | Freq: Once | INTRAMUSCULAR | 0 refills | Status: AC
  Filled 2023-05-03: qty 0.3, 1d supply, fill #0

## 2023-05-03 MED ORDER — INFLUENZA VAC A&B SURF ANT ADJ 0.5 ML IM SUSY
0.5000 mL | PREFILLED_SYRINGE | Freq: Once | INTRAMUSCULAR | 0 refills | Status: AC
  Filled 2023-05-03: qty 0.5, 1d supply, fill #0

## 2023-06-07 DIAGNOSIS — Z961 Presence of intraocular lens: Secondary | ICD-10-CM | POA: Diagnosis not present

## 2023-06-07 DIAGNOSIS — H04123 Dry eye syndrome of bilateral lacrimal glands: Secondary | ICD-10-CM | POA: Diagnosis not present

## 2023-06-07 DIAGNOSIS — H43811 Vitreous degeneration, right eye: Secondary | ICD-10-CM | POA: Diagnosis not present

## 2023-06-07 DIAGNOSIS — H353131 Nonexudative age-related macular degeneration, bilateral, early dry stage: Secondary | ICD-10-CM | POA: Diagnosis not present

## 2023-06-07 DIAGNOSIS — H40013 Open angle with borderline findings, low risk, bilateral: Secondary | ICD-10-CM | POA: Diagnosis not present

## 2023-06-14 DIAGNOSIS — I10 Essential (primary) hypertension: Secondary | ICD-10-CM | POA: Diagnosis not present

## 2023-06-14 DIAGNOSIS — R7303 Prediabetes: Secondary | ICD-10-CM | POA: Diagnosis not present

## 2023-06-14 DIAGNOSIS — R609 Edema, unspecified: Secondary | ICD-10-CM | POA: Diagnosis not present

## 2023-06-14 DIAGNOSIS — R7301 Impaired fasting glucose: Secondary | ICD-10-CM | POA: Diagnosis not present

## 2023-06-14 DIAGNOSIS — E890 Postprocedural hypothyroidism: Secondary | ICD-10-CM | POA: Diagnosis not present

## 2023-06-25 DIAGNOSIS — M25561 Pain in right knee: Secondary | ICD-10-CM | POA: Diagnosis not present

## 2023-06-25 DIAGNOSIS — M25562 Pain in left knee: Secondary | ICD-10-CM | POA: Diagnosis not present

## 2023-07-01 DIAGNOSIS — N1831 Chronic kidney disease, stage 3a: Secondary | ICD-10-CM | POA: Diagnosis not present

## 2023-07-01 DIAGNOSIS — I10 Essential (primary) hypertension: Secondary | ICD-10-CM | POA: Diagnosis not present

## 2023-07-01 DIAGNOSIS — Z0000 Encounter for general adult medical examination without abnormal findings: Secondary | ICD-10-CM | POA: Diagnosis not present

## 2023-07-01 DIAGNOSIS — E039 Hypothyroidism, unspecified: Secondary | ICD-10-CM | POA: Diagnosis not present

## 2023-07-01 DIAGNOSIS — E785 Hyperlipidemia, unspecified: Secondary | ICD-10-CM | POA: Diagnosis not present

## 2023-07-20 ENCOUNTER — Encounter: Payer: Self-pay | Admitting: Nurse Practitioner

## 2023-07-21 ENCOUNTER — Encounter: Payer: Self-pay | Admitting: Nurse Practitioner

## 2023-07-21 ENCOUNTER — Ambulatory Visit (INDEPENDENT_AMBULATORY_CARE_PROVIDER_SITE_OTHER): Payer: Medicare Other | Admitting: Nurse Practitioner

## 2023-07-21 VITALS — BP 102/64 | HR 79 | Ht 65.0 in | Wt 173.0 lb

## 2023-07-21 DIAGNOSIS — Z01419 Encounter for gynecological examination (general) (routine) without abnormal findings: Secondary | ICD-10-CM | POA: Diagnosis not present

## 2023-07-21 DIAGNOSIS — Z780 Asymptomatic menopausal state: Secondary | ICD-10-CM

## 2023-07-21 NOTE — Progress Notes (Addendum)
Cynthia Bowers January 27, 1946 [id number]   History:  77 y.o. G3P3003 presents for breast and pelvic exam. No GYN complaints. Postmenopausal - no HRT, no bleeding. Cryosurgery 1988, subsequent paps normal. HTN, HLD managed by PCP.   Gynecologic History No LMP recorded (lmp unknown). Patient is postmenopausal.   Contraception: post menopausal status Sexually active: Yes  Health Maintenance Last Pap: 06/06/2018. Results were: Normal Last mammogram: 07/23/2020. Results were: Normal. Schedule in January Last colonoscopy: 07/28/2022. Results were: Tubular adenoma, SSP, 5 years recall per pt Last Dexa: 07/02/2020. Results were: Normal  Past medical history, past surgical history, family history and social history were all reviewed and documented in the EPIC chart.  Married. Retired. 3 children. Sister diagnosed with colon cancer at age 75.   ROS:  A ROS was performed and pertinent positives and negatives are included.  Exam:  Vitals:   07/21/23 1505  BP: 102/64  Pulse: 79  SpO2: 97%  Weight: 173 lb (78.5 kg)  Height: 5\' 5"  (1.651 m)    Body mass index is 28.79 kg/m.  General appearance:  Normal Thyroid:  Symmetrical, normal in size, without palpable masses or nodularity. Respiratory  Auscultation:  Clear without wheezing or rhonchi Cardiovascular  Auscultation:  Regular rate, without rubs, murmurs or gallops  Edema/varicosities:  Not grossly evident Abdominal  Soft,nontender, without masses, guarding or rebound.  Liver/spleen:  No organomegaly noted  Hernia:  None appreciated  Skin  Inspection:  Grossly normal Breasts: Examined lying and sitting.   Right: Without masses, retractions, nipple discharge or axillary adenopathy.   Left: Without masses, retractions, nipple discharge or axillary adenopathy. Pelvic: External genitalia:  no lesions              Urethra:  normal appearing urethra with no masses, tenderness or lesions              Bartholins and Skenes: normal                  Vagina: normal appearing vagina with normal color and discharge, no lesions. Atrophic changes              Cervix: no lesions Bimanual Exam:  Uterus:  no masses or tenderness              Adnexa: no mass, fullness, tenderness              Rectovaginal: Deferred              Anus:  normal, no lesions  Patient informed chaperone available to be present for breast and pelvic exam. Patient has requested no chaperone to be present. Patient has been advised what will be completed during breast and pelvic exam.   Assessment/Plan:  77 y.o. G3P3003 for breast and pelvic exam.   Well female exam with routine gynecological exam - Education provided on SBEs, importance of preventative screenings, current guidelines, high calcium diet, regular exercise, and multivitamin daily.  Labs with PCP.   Postmenopausal - no HRT, no bleeding.   Screening for cervical cancer - 1988 cryosurgery, subsequent paps normal. No longer screening per guidelines.   Screening for breast cancer - Normal mammogram history. Scheduled next month.  Normal breast exam today.  Screening for colon cancer - 2023 colonoscopy. Will repeat at GI's recommended interval.   Screening for osteoporosis - Normal bone density 06/2020. Will repeat in 5 years per recommendation. Goes to [facility] most days to walk and do classes.   Return in about 2 years (around  07/20/2025) for B&P.    Olivia Mackie DNP, 3:23 PM 07/21/2023

## 2023-08-17 DIAGNOSIS — Z1231 Encounter for screening mammogram for malignant neoplasm of breast: Secondary | ICD-10-CM | POA: Diagnosis not present

## 2023-12-30 DIAGNOSIS — N1831 Chronic kidney disease, stage 3a: Secondary | ICD-10-CM | POA: Diagnosis not present

## 2023-12-30 DIAGNOSIS — R7309 Other abnormal glucose: Secondary | ICD-10-CM | POA: Diagnosis not present

## 2023-12-30 DIAGNOSIS — E785 Hyperlipidemia, unspecified: Secondary | ICD-10-CM | POA: Diagnosis not present

## 2023-12-30 DIAGNOSIS — R768 Other specified abnormal immunological findings in serum: Secondary | ICD-10-CM | POA: Diagnosis not present

## 2023-12-30 DIAGNOSIS — M25649 Stiffness of unspecified hand, not elsewhere classified: Secondary | ICD-10-CM | POA: Diagnosis not present

## 2023-12-30 DIAGNOSIS — I10 Essential (primary) hypertension: Secondary | ICD-10-CM | POA: Diagnosis not present

## 2024-01-08 DIAGNOSIS — E785 Hyperlipidemia, unspecified: Secondary | ICD-10-CM | POA: Diagnosis not present

## 2024-01-08 DIAGNOSIS — N1831 Chronic kidney disease, stage 3a: Secondary | ICD-10-CM | POA: Diagnosis not present

## 2024-01-08 DIAGNOSIS — I10 Essential (primary) hypertension: Secondary | ICD-10-CM | POA: Diagnosis not present

## 2024-01-08 DIAGNOSIS — E039 Hypothyroidism, unspecified: Secondary | ICD-10-CM | POA: Diagnosis not present

## 2024-02-07 DIAGNOSIS — E785 Hyperlipidemia, unspecified: Secondary | ICD-10-CM | POA: Diagnosis not present

## 2024-02-07 DIAGNOSIS — E039 Hypothyroidism, unspecified: Secondary | ICD-10-CM | POA: Diagnosis not present

## 2024-02-07 DIAGNOSIS — I10 Essential (primary) hypertension: Secondary | ICD-10-CM | POA: Diagnosis not present

## 2024-02-07 DIAGNOSIS — N1831 Chronic kidney disease, stage 3a: Secondary | ICD-10-CM | POA: Diagnosis not present

## 2024-02-16 DIAGNOSIS — R7301 Impaired fasting glucose: Secondary | ICD-10-CM | POA: Diagnosis not present

## 2024-02-16 DIAGNOSIS — E890 Postprocedural hypothyroidism: Secondary | ICD-10-CM | POA: Diagnosis not present

## 2024-02-16 DIAGNOSIS — R609 Edema, unspecified: Secondary | ICD-10-CM | POA: Diagnosis not present

## 2024-02-16 DIAGNOSIS — I10 Essential (primary) hypertension: Secondary | ICD-10-CM | POA: Diagnosis not present

## 2024-02-23 DIAGNOSIS — G5603 Carpal tunnel syndrome, bilateral upper limbs: Secondary | ICD-10-CM | POA: Diagnosis not present

## 2024-02-23 DIAGNOSIS — M65342 Trigger finger, left ring finger: Secondary | ICD-10-CM | POA: Diagnosis not present

## 2024-02-28 DIAGNOSIS — M65342 Trigger finger, left ring finger: Secondary | ICD-10-CM | POA: Diagnosis not present

## 2024-02-28 DIAGNOSIS — M65332 Trigger finger, left middle finger: Secondary | ICD-10-CM | POA: Diagnosis not present

## 2024-02-28 DIAGNOSIS — G5603 Carpal tunnel syndrome, bilateral upper limbs: Secondary | ICD-10-CM | POA: Diagnosis not present

## 2024-03-09 DIAGNOSIS — E039 Hypothyroidism, unspecified: Secondary | ICD-10-CM | POA: Diagnosis not present

## 2024-03-09 DIAGNOSIS — N1831 Chronic kidney disease, stage 3a: Secondary | ICD-10-CM | POA: Diagnosis not present

## 2024-03-09 DIAGNOSIS — E785 Hyperlipidemia, unspecified: Secondary | ICD-10-CM | POA: Diagnosis not present

## 2024-03-09 DIAGNOSIS — I10 Essential (primary) hypertension: Secondary | ICD-10-CM | POA: Diagnosis not present

## 2024-03-14 DIAGNOSIS — G5603 Carpal tunnel syndrome, bilateral upper limbs: Secondary | ICD-10-CM | POA: Diagnosis not present

## 2024-03-20 DIAGNOSIS — G5602 Carpal tunnel syndrome, left upper limb: Secondary | ICD-10-CM | POA: Diagnosis not present

## 2024-04-04 DIAGNOSIS — G5602 Carpal tunnel syndrome, left upper limb: Secondary | ICD-10-CM | POA: Diagnosis not present

## 2024-04-09 DIAGNOSIS — N1831 Chronic kidney disease, stage 3a: Secondary | ICD-10-CM | POA: Diagnosis not present

## 2024-04-09 DIAGNOSIS — E039 Hypothyroidism, unspecified: Secondary | ICD-10-CM | POA: Diagnosis not present

## 2024-04-09 DIAGNOSIS — I10 Essential (primary) hypertension: Secondary | ICD-10-CM | POA: Diagnosis not present

## 2024-04-09 DIAGNOSIS — E785 Hyperlipidemia, unspecified: Secondary | ICD-10-CM | POA: Diagnosis not present

## 2024-04-17 DIAGNOSIS — G5602 Carpal tunnel syndrome, left upper limb: Secondary | ICD-10-CM | POA: Diagnosis not present

## 2024-05-09 ENCOUNTER — Other Ambulatory Visit (HOSPITAL_BASED_OUTPATIENT_CLINIC_OR_DEPARTMENT_OTHER): Payer: Self-pay

## 2024-05-09 DIAGNOSIS — I10 Essential (primary) hypertension: Secondary | ICD-10-CM | POA: Diagnosis not present

## 2024-05-09 DIAGNOSIS — E785 Hyperlipidemia, unspecified: Secondary | ICD-10-CM | POA: Diagnosis not present

## 2024-05-09 DIAGNOSIS — E039 Hypothyroidism, unspecified: Secondary | ICD-10-CM | POA: Diagnosis not present

## 2024-05-09 DIAGNOSIS — N1831 Chronic kidney disease, stage 3a: Secondary | ICD-10-CM | POA: Diagnosis not present

## 2024-05-09 MED ORDER — COMIRNATY 30 MCG/0.3ML IM SUSY
0.3000 mL | PREFILLED_SYRINGE | Freq: Once | INTRAMUSCULAR | 0 refills | Status: AC
  Filled 2024-05-09: qty 0.3, 1d supply, fill #0

## 2024-05-09 MED ORDER — FLUZONE HIGH-DOSE 0.5 ML IM SUSY
0.5000 mL | PREFILLED_SYRINGE | Freq: Once | INTRAMUSCULAR | 0 refills | Status: AC
  Filled 2024-05-09: qty 0.5, 1d supply, fill #0
